# Patient Record
Sex: Female | Born: 1980 | Race: White | Hispanic: No | Marital: Married | Smoking: Former smoker
Health system: Southern US, Community
[De-identification: ages and names within clinical notes are randomized; demographics above are authoritative.]

## PROBLEM LIST (undated history)

## (undated) DIAGNOSIS — F32A Depression, unspecified: Secondary | ICD-10-CM

## (undated) DIAGNOSIS — F329 Major depressive disorder, single episode, unspecified: Secondary | ICD-10-CM

## (undated) DIAGNOSIS — N979 Female infertility, unspecified: Secondary | ICD-10-CM

## (undated) HISTORY — PX: TONSILLECTOMY: SHX5217

## (undated) HISTORY — DX: Major depressive disorder, single episode, unspecified: F32.9

## (undated) HISTORY — DX: Depression, unspecified: F32.A

## (undated) HISTORY — PX: TONSILLECTOMY: SUR1361

## (undated) HISTORY — PX: NECK SURGERY: SHX720

## (undated) HISTORY — DX: Female infertility, unspecified: N97.9

---

## 2012-04-28 ENCOUNTER — Encounter: Payer: Self-pay | Admitting: Family Medicine

## 2012-04-28 ENCOUNTER — Ambulatory Visit (INDEPENDENT_AMBULATORY_CARE_PROVIDER_SITE_OTHER): Payer: BC Managed Care – PPO | Admitting: Family Medicine

## 2012-04-28 VITALS — BP 108/68 | HR 80 | Temp 98.2°F | Ht 63.0 in | Wt 230.0 lb

## 2012-04-28 DIAGNOSIS — Z23 Encounter for immunization: Secondary | ICD-10-CM

## 2012-04-28 DIAGNOSIS — L723 Sebaceous cyst: Secondary | ICD-10-CM

## 2012-04-28 DIAGNOSIS — F3289 Other specified depressive episodes: Secondary | ICD-10-CM | POA: Insufficient documentation

## 2012-04-28 DIAGNOSIS — F329 Major depressive disorder, single episode, unspecified: Secondary | ICD-10-CM
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
## 2012-04-28 NOTE — Patient Instructions (Signed)
Warm compresses to left underarm and right breast lesion. If the area of redness on the right breast increases in size, develops streaks, gets hot, if you get fevers, then you likely need an antibiotic to treat for a skin infection.  For now, just use warm compresses 2-3 x daily, and apply an antibacterial ointment like bacitracin 2-3 times per day.  Epidermal Cyst An epidermal cyst is sometimes called a sebaceous cyst, epidermal inclusion cyst, or infundibular cyst. These cysts usually contain a substance that looks "pasty" or "cheesy" and may have a bad smell. This substance is a protein called keratin. Epidermal cysts are usually found on the face, neck, or trunk. They may also occur in the vaginal area or other parts of the genitalia of both men and women. Epidermal cysts are usually small, painless, slow-growing bumps or lumps that move freely under the skin. It is important not to try to pop them. This may cause an infection and lead to tenderness and swelling. CAUSES  Epidermal cysts may be caused by a deep penetrating injury to the skin or a plugged hair follicle, often associated with acne. SYMPTOMS  Epidermal cysts can become inflamed and cause:  Redness.  Tenderness.  Increased temperature of the skin over the bumps or lumps.  Grayish-white, bad smelling material that drains from the bump or lump. DIAGNOSIS  Epidermal cysts are easily diagnosed by your caregiver during an exam. Rarely, a tissue sample (biopsy) may be taken to rule out other conditions that may resemble epidermal cysts. TREATMENT   Epidermal cysts often get better and disappear on their own. They are rarely ever cancerous.  If a cyst becomes infected, it may become inflamed and tender. This may require opening and draining the cyst. Treatment with antibiotics may be necessary. When the infection is gone, the cyst may be removed with minor surgery.  Small, inflamed cysts can often be treated with antibiotics or by  injecting steroid medicines.  Sometimes, epidermal cysts become large and bothersome. If this happens, surgical removal in your caregiver's office may be necessary. HOME CARE INSTRUCTIONS  Only take over-the-counter or prescription medicines as directed by your caregiver.  Take your antibiotics as directed. Finish them even if you start to feel better. SEEK MEDICAL CARE IF:   Your cyst becomes tender, red, or swollen.  Your condition is not improving or is getting worse.  You have any other questions or concerns. MAKE SURE YOU:  Understand these instructions.  Will watch your condition.  Will get help right away if you are not doing well or get worse. Document Released: 06/06/2004 Document Revised: 09/28/2011 Document Reviewed: 01/12/2011 [facility] Patient Information 2013 [place], [place].    Look into getting an appointment with Behavioral Health (through [facility]) for evaluation and treatment of depression.

## 2012-05-13 ENCOUNTER — Ambulatory Visit: Payer: BC Managed Care – PPO

## 2012-05-13 ENCOUNTER — Ambulatory Visit (INDEPENDENT_AMBULATORY_CARE_PROVIDER_SITE_OTHER): Payer: BC Managed Care – PPO | Admitting: Family Medicine

## 2012-05-13 VITALS — BP 128/82 | HR 109 | Temp 98.6°F | Resp 17 | Ht 63.0 in | Wt 226.0 lb

## 2012-05-13 DIAGNOSIS — B349 Viral infection, unspecified: Secondary | ICD-10-CM

## 2012-05-13 DIAGNOSIS — R109 Unspecified abdominal pain: Secondary | ICD-10-CM

## 2012-05-13 DIAGNOSIS — R509 Fever, unspecified: Secondary | ICD-10-CM

## 2012-05-13 DIAGNOSIS — B9789 Other viral agents as the cause of diseases classified elsewhere: Secondary | ICD-10-CM

## 2012-05-13 DIAGNOSIS — IMO0001 Reserved for inherently not codable concepts without codable children: Secondary | ICD-10-CM

## 2012-05-13 DIAGNOSIS — F329 Major depressive disorder, single episode, unspecified: Secondary | ICD-10-CM

## 2012-05-13 DIAGNOSIS — M791 Myalgia, unspecified site: Secondary | ICD-10-CM

## 2012-05-13 LAB — POCT URINALYSIS DIPSTICK
Glucose, UA: NEGATIVE
Ketones, UA: 15
Leukocytes, UA: NEGATIVE
Nitrite, UA: NEGATIVE
Spec Grav, UA: 1.025
Urobilinogen, UA: 0.2
pH, UA: 5.5

## 2012-05-13 LAB — POCT CBC
Hemoglobin: 14.2 g/dL (ref 12.2–16.2)
Lymph, poc: 1.4 (ref 0.6–3.4)
MCHC: 31.8 g/dL (ref 31.8–35.4)
MPV: 8 fL (ref 0–99.8)
POC Granulocyte: 4 (ref 2–6.9)
POC LYMPH PERCENT: 23.3 %L (ref 10–50)
POC MID %: 7.1 %M (ref 0–12)
RDW, POC: 12.7 %

## 2012-05-13 LAB — POCT UA - MICROSCOPIC ONLY
Casts, Ur, LPF, POC: NEGATIVE
Crystals, Ur, HPF, POC: NEGATIVE
Yeast, UA: NEGATIVE

## 2012-05-13 LAB — GLUCOSE, POCT (MANUAL RESULT ENTRY): POC Glucose: 96 mg/dl (ref 70–99)

## 2012-05-13 NOTE — Patient Instructions (Addendum)
Get plenty of rest and drink at least 64 ounces of water daily. Use ibuprofen or acetaminophen as needed for fever or muscle aches.

## 2012-05-13 NOTE — Progress Notes (Signed)
Subjective:    Patient ID: Annette Ashley, female    DOB: 06-22-1981, 31 y.o.   MRN: [id number]  HPI  This 31 y.o. female presents for evaluation of chills and achy x 3 days.  Also right-sided low abdominal pain with urination.  Last night, temperature was 102.4.  Took Excedrin and went to bed.  This morning, 100.7, and developed abdominal cramping and increased stool frequency and loose stools.  Additionally, reports 1 week of right sided low back pain.  Some nausea, loss of appetite (last eating yesterday afternoon).  No vomiting.  No hematuria.  No blood or mucous in the stools.  No urinary frequency or burning.  Mild urgency in the mornings. No known sick contacts.  History of UTI, this feels different. History of nephrolithiasis-had back pain, different from this.  Long-standing depression.  Not currently on treatment.  Denies thoughts of self or other harm.  Not motivated.  Doesn't want to be around people. Hasn't gone back to work since she and her husband moved here from [place] (prior to that, they lived in [place]).  Review of Systems As above.   Past Medical History  Diagnosis Date  . Depression     Past Surgical History  Procedure Date  . Tonsillectomy age 54  . Neck surgery     cyst removed from L anterior neck    Prior to Admission medications   Not on File    Allergies  Allergen Reactions  . Clindamycin/Lincomycin Rash    Oral rash    History   Social History  . Marital Status: Married    Spouse Name: Jomarie Longs    Number of Children: 0  . Years of Education: 10   Occupational History  . homemaker    Social History Main Topics  . Smoking status: Former Smoker    Types: Cigarettes    Quit date: 09/28/2009  . Smokeless tobacco: Never Used  . Alcohol Use: No  . Drug Use: No  . Sexually Active: Yes -- Female partner(s)    Birth Control/ Protection: None     infertility work-up found no cause   Other Topics Concern  . Not on file   Social  History Narrative   Lives with husband, 2 dogsCompleted the 10th grade, then obtained her GED. Recently moved to [facility] for her husband's job.      Family History  Problem Relation Age of Onset  . Cancer Father     lung cancer  . Heart disease Father   . Diabetes Father   . Kidney disease Father     on dialysis  . Bipolar disorder Brother   . Diabetes Maternal Aunt   . Diabetes Maternal Uncle   . Diabetes Paternal Aunt   . Diabetes Paternal Uncle   . Diabetes Maternal Grandmother   . Diabetes Paternal Grandmother   . Breast cancer Cousin        Objective:   Physical Exam  Blood pressure 128/82, pulse 109, temperature 98.6 F (37 C), temperature source Oral, resp. rate 17, height 5\' 3"  (1.6 m), weight 226 lb (102.513 kg), last menstrual period 04/21/2012, SpO2 98.00%. Body mass index is 40.03 kg/(m^2). Well-developed, well nourished WF who is awake, alert and oriented, in NAD. HEENT: /AT, PERRL, EOMI.  Fundi are normal. Sclera and conjunctiva are clear.  EAC are patent, TMs are normal in appearance. Nasal mucosa is pink and moist. OP is clear. Neck: supple, non-tender, no lymphadenopathy, thyromegaly. Heart: RRR, no murmur Lungs: normal  effort, CTA Abdomen: normo-active bowel sounds, supple, non-tender, no mass or organomegaly. No CVA tenderness. Extremities: no cyanosis, clubbing or edema. Skin: warm and dry without rash. Psychologic: good mood and appropriate affect, normal speech and behavior.   Results for orders placed in visit on 05/13/12  POCT UA - MICROSCOPIC ONLY      Component Value Range   WBC, Ur, HPF, POC 2-4     RBC, urine, microscopic 3-5     Bacteria, U Microscopic 1+     Mucus, UA 2+     Epithelial cells, urine per micros 3-6     Crystals, Ur, HPF, POC neg     Casts, Ur, LPF, POC neg     Yeast, UA neg    POCT URINALYSIS DIPSTICK      Component Value Range   Color, UA yellow     Clarity, UA clear     Glucose, UA neg     Bilirubin, UA small      Ketones, UA 15     Spec Grav, UA 1.025     Blood, UA trace     pH, UA 5.5     Protein, UA trace     Urobilinogen, UA 0.2     Nitrite, UA neg     Leukocytes, UA Negative    POCT CBC      Component Value Range   WBC 5.8  4.6 - 10.2 K/uL   Lymph, poc 1.4  0.6 - 3.4   POC LYMPH PERCENT 23.3  10 - 50 %L   MID (cbc) 0.4  0 - 0.9   POC MID % 7.1  0 - 12 %M   POC Granulocyte 4.0  2 - 6.9   Granulocyte percent 69.6  37 - 80 %G   RBC 5.00  4.04 - 5.48 M/uL   Hemoglobin 14.2  12.2 - 16.2 g/dL   HCT, POC 40.9  81.1 - 47.9 %   MCV 89.4  80 - 97 fL   MCH, POC 28.4  27 - 31.2 pg   MCHC 31.8  31.8 - 35.4 g/dL   RDW, POC 91.4     Platelet Count, POC 271  142 - 424 K/uL   MPV 8.0  0 - 99.8 fL  GLUCOSE, POCT (MANUAL RESULT ENTRY)      Component Value Range   POC Glucose 96  70 - 99 mg/dl   Acute Abdominal Series: [facility] reading (PRIMARY) by  Dr. Katrinka Blazing.  No free air.  Chest normal.  Non-specific bowel gas pattern with increased air in the ascending colon.  No ileus.  No mass. Normal abdomen/pelvis.     Assessment & Plan:   1. Viral illness    2. Abdominal pain  POCT UA - Microscopic Only, POCT urinalysis dipstick, Urine culture, POCT glucose (manual entry), DG Abd Acute W/Chest  3. Fever  Urine culture, POCT CBC, DG Abd Acute W/Chest  4. Muscle pain    5. Depressive disorder, not elsewhere classified     Anticipatory guidance provided.  Supportive care.  RTC if symptoms worsen/persist.  Discussed with Dr. Katrinka Blazing.

## 2012-05-16 ENCOUNTER — Telehealth: Payer: Self-pay

## 2012-05-16 NOTE — Telephone Encounter (Signed)
Please call patient. Urine culture was NEGATIVE. RTC if symptoms persist.

## 2012-05-16 NOTE — Telephone Encounter (Signed)
Pt is returning a call from the lab  Best number [phone]

## 2012-05-16 NOTE — Telephone Encounter (Signed)
Called pt, she is advised.

## 2012-06-25 NOTE — Progress Notes (Signed)
Reviewed and agree.

## 2012-07-28 ENCOUNTER — Encounter: Payer: Self-pay | Admitting: Internal Medicine

## 2012-08-11 ENCOUNTER — Ambulatory Visit (INDEPENDENT_AMBULATORY_CARE_PROVIDER_SITE_OTHER): Payer: BC Managed Care – PPO | Admitting: Family Medicine

## 2012-08-11 ENCOUNTER — Other Ambulatory Visit (HOSPITAL_COMMUNITY)
Admission: RE | Admit: 2012-08-11 | Discharge: 2012-08-11 | Disposition: A | Discharge status: Home / Self Care | Payer: BC Managed Care – PPO | Source: Ambulatory Visit | Attending: Family Medicine | Admitting: Family Medicine

## 2012-08-11 ENCOUNTER — Encounter: Payer: Self-pay | Admitting: Family Medicine

## 2012-08-11 VITALS — BP 120/70 | HR 72 | Ht 63.0 in | Wt 237.0 lb

## 2012-08-11 DIAGNOSIS — Z6841 Body Mass Index (BMI) 40.0 and over, adult: Secondary | ICD-10-CM

## 2012-08-11 DIAGNOSIS — R5381 Other malaise: Secondary | ICD-10-CM

## 2012-08-11 DIAGNOSIS — N926 Irregular menstruation, unspecified: Secondary | ICD-10-CM

## 2012-08-11 DIAGNOSIS — Z13220 Encounter for screening for lipoid disorders: Secondary | ICD-10-CM

## 2012-08-11 DIAGNOSIS — R5383 Other fatigue: Secondary | ICD-10-CM

## 2012-08-11 DIAGNOSIS — Z0000 Encounter for general adult medical examination without abnormal findings: Secondary | ICD-10-CM

## 2012-08-11 DIAGNOSIS — Z23 Encounter for immunization: Secondary | ICD-10-CM

## 2012-08-11 DIAGNOSIS — Z8249 Family history of ischemic heart disease and other diseases of the circulatory system: Secondary | ICD-10-CM

## 2012-08-11 DIAGNOSIS — Z01419 Encounter for gynecological examination (general) (routine) without abnormal findings: Secondary | ICD-10-CM | POA: Insufficient documentation

## 2012-08-11 LAB — COMPREHENSIVE METABOLIC PANEL
Albumin: 4.3 g/dL (ref 3.5–5.2)
Alkaline Phosphatase: 73 U/L (ref 39–117)
CO2: 24 mEq/L (ref 19–32)
Chloride: 103 mEq/L (ref 96–112)
Glucose, Bld: 84 mg/dL (ref 70–99)
Potassium: 4.1 mEq/L (ref 3.5–5.3)
Sodium: 139 mEq/L (ref 135–145)
Total Protein: 6.9 g/dL (ref 6.0–8.3)

## 2012-08-11 LAB — LIPID PANEL: Total CHOL/HDL Ratio: 4 Ratio

## 2012-08-11 LAB — CBC WITH DIFFERENTIAL/PLATELET
Basophils Relative: 0 % (ref 0–1)
Eosinophils Absolute: 0.2 10*3/uL (ref 0.0–0.7)
Lymphs Abs: 2.9 10*3/uL (ref 0.7–4.0)
MCH: 28.7 pg (ref 26.0–34.0)
MCHC: 34.2 g/dL (ref 30.0–36.0)
Neutrophils Relative %: 57 % (ref 43–77)
Platelets: 347 10*3/uL (ref 150–400)
RBC: 4.64 MIL/uL (ref 3.87–5.11)

## 2012-08-11 NOTE — Patient Instructions (Signed)
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
## 2012-08-12 ENCOUNTER — Encounter: Payer: Self-pay | Admitting: Family Medicine

## 2012-08-12 DIAGNOSIS — E559 Vitamin D deficiency, unspecified: Secondary | ICD-10-CM | POA: Insufficient documentation

## 2012-08-12 LAB — VITAMIN D 25 HYDROXY (VIT D DEFICIENCY, FRACTURES): Vit D, 25-Hydroxy: 19 ng/mL — ABNORMAL LOW (ref 30–89)

## 2012-08-15 ENCOUNTER — Encounter: Payer: Self-pay | Admitting: Family Medicine

## 2012-08-15 ENCOUNTER — Other Ambulatory Visit: Payer: Self-pay | Admitting: *Deleted

## 2012-08-15 DIAGNOSIS — E559 Vitamin D deficiency, unspecified: Secondary | ICD-10-CM

## 2012-08-15 DIAGNOSIS — R748 Abnormal levels of other serum enzymes: Secondary | ICD-10-CM

## 2012-08-15 MED ORDER — ERGOCALCIFEROL 1.25 MG (50000 UT) PO CAPS: 50000.0000 [IU] | ORAL_CAPSULE | ORAL | Status: DC

## 2012-09-06 ENCOUNTER — Ambulatory Visit (HOSPITAL_COMMUNITY): Payer: BC Managed Care – PPO | Admitting: Psychiatry

## 2012-09-15 ENCOUNTER — Telehealth: Payer: Self-pay | Admitting: *Deleted

## 2012-09-15 ENCOUNTER — Encounter: Payer: Self-pay | Admitting: Family Medicine

## 2012-09-15 NOTE — Telephone Encounter (Signed)
Patient informed. 
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
## 2012-09-15 NOTE — Telephone Encounter (Signed)
Advise pt that warm soaks may help.  Also a barrier cream like a desitin (diaper creme) should lessen the irritation.  If this is a cut/abrasion related to trauma from sex, it usually heals pretty quickly over a couple of days.  The other thing in the differential is ulceration from something like herpes.  Would need eval for treatment other than these topical measures.

## 2012-10-27 ENCOUNTER — Encounter: Payer: Self-pay | Admitting: Family Medicine

## 2012-11-15 ENCOUNTER — Encounter: Payer: Self-pay | Admitting: Family Medicine

## 2012-11-17 ENCOUNTER — Ambulatory Visit (INDEPENDENT_AMBULATORY_CARE_PROVIDER_SITE_OTHER): Payer: BC Managed Care – PPO | Admitting: Family Medicine

## 2012-11-17 ENCOUNTER — Encounter: Payer: Self-pay | Admitting: Family Medicine

## 2012-11-17 VITALS — BP 130/88 | HR 80 | Temp 98.3°F | Ht 63.0 in | Wt 229.0 lb

## 2012-11-17 DIAGNOSIS — J029 Acute pharyngitis, unspecified: Secondary | ICD-10-CM

## 2012-11-17 DIAGNOSIS — J069 Acute upper respiratory infection, unspecified: Secondary | ICD-10-CM

## 2012-11-17 NOTE — Progress Notes (Signed)
Chief Complaint  Patient presents with  . Sore Throat    runny nose, nasal congestion, coughing, headache and nausea. Took allergy medicine and alka-seltzer day/night without any relief.  (negative strep test)   Started 5 days ago with itchy throat, watery eyes, then progressed to sore throat, runny/stuffy nose, headaches (posterior) and cough.  Nasal mucus is usually clear, but sometimes sees blood and yellow, but is mostly clear.  Cough is usually dry, once coughed up a "chunk" that was yellow.  Thinks she may have had a fever, as she was shivering in bed.  She also had some myalgias.  Husband was sick with similar symptoms for about 10 days, and resolved.  Didn't need antibiotics, but took some allergy meds. No h/o allergies in the past.  Took alka-seltzer day/night without much benefit.  Also took a generic zyrtec. Main complaint is sore throat.  Past Medical History  Diagnosis Date  . Depression   . Infertility, female    History   Social History  . Marital Status: Married    Spouse Name: Jomarie Longs    Number of Children: 0  . Years of Education: 10   Occupational History  . homemaker    Social History Main Topics  . Smoking status: Former Smoker    Types: Cigarettes    Quit date: 09/28/2009  . Smokeless tobacco: Never Used  . Alcohol Use: No  . Drug Use: No  . Sexually Active: Yes -- Female partner(s)    Birth Control/ Protection: None     Comment: infertility work-up found no cause   Other Topics Concern  . Not on file   Social History Narrative   Lives with husband, 2 dogs   Completed the 10th grade, then obtained her GED. Recently moved [place] [facility] [place] her husband's job.     Current Outpatient Prescriptions on File Prior to Visit  Medication Sig Dispense Refill  . ergocalciferol (VITAMIN D2) 50000 UNITS capsule Take 1 capsule (50,000 Units total) by mouth once a week.  4 capsule  2   No current facility-administered medications on file prior to visit.    Allergies  Allergen Reactions  . Clindamycin/Lincomycin Rash    Oral rash   ROS: Denies sinus pain, vomiting or diarrhea, but does have some nausea, skin rashes.  Some chronic pain in left leg, otherwise no arthralgias, some muscle aches.  PHYSICAL EXAM: BP 130/88  Pulse 80  Temp(Src) 98.3 F (36.8 C) (Oral)  Ht 5\' 3"  (1.6 m)  Wt 229 lb (103.874 kg)  BMI 40.58 kg/m2  LMP 10/16/2012  Well developed, obese female, sounds nasal, in no distress.  Rare cough HEENT:  PERRL, EOMI, conjunctiva clear.  TM's and EAC's normal. Nasal mucosa mildly edematous, no erythema or purulence.  OP with erythema posteriorly, otherwise normal.  Moist mucus membranes.  Sinuses--minimal tenderness over maxillary sinuses Neck: shotty lymphadenopathy Heart: regular rate and rhythm without murmur Lungs: clear bilaterally Extremities: no edema Skin: no rash Psych: flat affect  ASSESSMENT/PLAN: Acute upper respiratory infections of unspecified site  Sore throat - Plan: Rapid Strep A  URI Supportive measures reviewed.  Continue with zyrtec daily. Mucinex  (plain, or the DM version if still coughing a lot) Use sudafed as needed (decongestant) for sinus pain and ongoing congestion/runny nose. Use ibuprofen or aleve, and/or tylenol if needed for sore throat/headache You may also try salt water gargles and/or chloraseptic spray and lozenges to help with your throat pain.  Expect symptoms to improve in the next  3-5 days.  Call next week for antibiotic if you have ongoing/worsening symptoms, especially if fevers, discolored mucus, worsening sinus pain.

## 2012-11-17 NOTE — Patient Instructions (Signed)
  Continue with zyrtec daily. Mucinex  (plain, or the DM version if still coughing a lot) Use sudafed as needed (decongestant) for sinus pain and ongoing congestion/runny nose. Use ibuprofen or aleve, and/or tylenol if needed for sore throat/headache You may also try salt water gargles and/or chloraseptic spray and lozenges to help with your throat pain.  Expect symptoms to improve in the next 3-5 days.  Call next week for antibiotic if you have ongoing/worsening symptoms, especially if fevers, discolored mucus, worsening sinus pain.  I recommend the above over-the-counter medications to be used IN PLACE of the alka selzer medication you have been using.

## 2013-02-09 ENCOUNTER — Other Ambulatory Visit: Payer: BC Managed Care – PPO

## 2013-02-23 ENCOUNTER — Encounter: Payer: Self-pay | Admitting: Family Medicine

## 2013-02-23 ENCOUNTER — Ambulatory Visit (INDEPENDENT_AMBULATORY_CARE_PROVIDER_SITE_OTHER): Payer: BC Managed Care – PPO | Admitting: Family Medicine

## 2013-02-23 VITALS — BP 124/84 | HR 68 | Ht 63.0 in | Wt 228.0 lb

## 2013-02-23 DIAGNOSIS — N898 Other specified noninflammatory disorders of vagina: Secondary | ICD-10-CM

## 2013-02-23 DIAGNOSIS — N939 Abnormal uterine and vaginal bleeding, unspecified: Secondary | ICD-10-CM

## 2013-02-23 DIAGNOSIS — R002 Palpitations: Secondary | ICD-10-CM

## 2013-02-23 DIAGNOSIS — E559 Vitamin D deficiency, unspecified: Secondary | ICD-10-CM

## 2013-02-23 DIAGNOSIS — R748 Abnormal levels of other serum enzymes: Secondary | ICD-10-CM

## 2013-02-23 LAB — CBC WITH DIFFERENTIAL/PLATELET
Basophils Relative: 0 % (ref 0–1)
Hemoglobin: 13.1 g/dL (ref 12.0–15.0)
Lymphs Abs: 2.8 10*3/uL (ref 0.7–4.0)
MCHC: 34.5 g/dL (ref 30.0–36.0)
Monocytes Relative: 6 % (ref 3–12)
Neutro Abs: 4.9 10*3/uL (ref 1.7–7.7)
Neutrophils Relative %: 58 % (ref 43–77)
RBC: 4.49 MIL/uL (ref 3.87–5.11)
WBC: 8.5 10*3/uL (ref 4.0–10.5)

## 2013-02-23 LAB — POCT URINE PREGNANCY: Preg Test, Ur: NEGATIVE

## 2013-02-23 MED ORDER — MEDROXYPROGESTERONE ACETATE 10 MG PO TABS: 10.0000 mg | ORAL_TABLET | Freq: Every day | ORAL | Status: DC

## 2013-02-23 NOTE — Patient Instructions (Signed)
Start the provera today.  Take it for 10 days.  Bleeding should stop while you are this medication.  You will have bleeding recur (might be like a regular period, but might be light) within 10 days (usually just 2-3 days after) of completing the provera.  Please contact our office if you do not have ANY bleeding within 10 days of the provera.    If your periods remain very irregular, and you desire to start the birth control pills to regulate them, please call the office (appointment isn't necessary, as long as we can confirm you aren't pregnant).

## 2013-02-23 NOTE — Progress Notes (Signed)
Chief Complaint  Patient presents with  . Menstrual Problem    havs had a period since June 28th that will not stop. Also was supposed to have hepatic panel and vit d done 02/09/13, would like to do today. Also states that her heart races sometimes. And she has some random bumps if you had time to take a look.   Periods had been regular, monthly, a little lighter than usual, since her last physical in January, up until the end of June (prior to that her menses were irregular--see discussion at last physical).  Her period the end of June was very heavy, and never really stopped since then.  She would have the period get very light for 2-3 days, never completely stop, then start again.  She had heavy bleeding x 10 days up until 2 days ago, when it again lightened up.  Having light bleeding today.  She had severe L low back/side pain on 6/28, which was on and off that whole day (was very severe for just 2 minutes) with intermittent mild discomfort since then.  Denies any significant abdominal cramping.  She has been feeling weak.  Denies lightheadedness.  Her heart will sometimes start racing fast and hard, lasts just for a few seconds.  Will occur at rest. Happens just a few times/week.  Past Medical History  Diagnosis Date  . Depression   . Infertility, female    Past Surgical History  Procedure Laterality Date  . Tonsillectomy  age 53  . Neck surgery      cyst removed from L anterior neck   History   Social History  . Marital Status: Married    Spouse Name: Jomarie Longs    Number of Children: 0  . Years of Education: 10   Occupational History  . homemaker    Social History Main Topics  . Smoking status: Former Smoker    Types: Cigarettes    Quit date: 09/28/2009  . Smokeless tobacco: Never Used  . Alcohol Use: No  . Drug Use: No  . Sexually Active: Yes -- Female partner(s)    Birth Control/ Protection: None     Comment: infertility work-up found no cause   Other Topics Concern  . Not  on file   Social History Narrative   Lives with husband, 2 dogs   Completed the 10th grade, then obtained her GED. Recently moved to [facility] for her husband's job.     Current outpatient prescriptions:cholecalciferol (VITAMIN D) 1000 UNITS tablet, Take 1,000 Units by mouth daily., Disp: , Rfl:   Allergies  Allergen Reactions  . Clindamycin/Lincomycin Rash    Oral rash   ROS: Denies urgency, frequency, dysuria or hematuria.  Denies abnormal vaginal discharge, itching.  She notices a small "knot" above the clitoris, "internally" per pt.  Nontender, and hasn't changed for months. No cough, shortness of breath, or other complaints except per HPI.  PHYSICAL EXAM: BP 124/84  Pulse 68  Ht 5\' 3"  (1.6 m)  Wt 228 lb (103.42 kg)  BMI 40.4 kg/m2  LMP 01/14/2013 Well developed, obese female in no distress, accompanied by her husband Neck: no lymphadenopathy or thyromegaly Heart:  Regular rate and rhythm without ectopy, murmur, rub or gallop Lungs: clear bilaterally Abdomen: soft, minimal tenderness across lower abdomen--no focal tenderness, rebound, guarding or mass Extremities: no edema Skin: <6mm papule on inner R arm, small dry patch inner L arm, nonspecific Pelvic exam deferred.  ASSESSMENT/PLAN: Abnormal vaginal bleeding - Plan: CBC with Differential, POCT urine  pregnancy, medroxyPROGESTERone (PROVERA) 10 MG tablet  Unspecified vitamin D deficiency - Plan: Vitamin D, 25-hydroxy  Abnormal liver enzymes - Plan: Hepatic Function Panel  Palpitations  Abnormal vaginal bleeding.  Likely anovulatory.  Recommend Provera 10mg  x 10 days.  Expect bleeding to stop while on medication, but then having a withdrawal bleed/period.  That should reset things to stop this chronic bleeding, although periods may not be regular.  Elects to hold off on starting OCP's after the provera course, and see what her periods do. If she has NO withdrawal bleed at all, she is to contact us.  At that point we may  elect to put her on OCP's for 3-38months.  We briefly discussed that ultrasound may be indicated at some point for evaluation of ongoing abnormal bleeding.   Discussed fertility--if she has any desire to try for pregnancy, we can refer her back  (previously didn't tolerate metformin).  Risks/benefits of OCP's reviewed with patient, so if she decides in future to start, can call for rx (after clarifying that she couldn't potentially be pregnant, based on LMP or home pregnancy test).  Palpitations--encouraged avoidance of caffeine, keep well hydrated.  Discussed differential diagnosis (PVC's, sinus tach, SVT, afib vs other arrhythmia).  Given infrequent, short-lived with no associated symptoms, will just monitor for now.  Thyroid test was normal in January.  Reassured re: skin concerns (nonspecific, possible insect bite)

## 2013-02-24 LAB — HEPATIC FUNCTION PANEL
Albumin: 4.2 g/dL (ref 3.5–5.2)
Total Protein: 6.9 g/dL (ref 6.0–8.3)

## 2013-02-24 LAB — VITAMIN D 25 HYDROXY (VIT D DEFICIENCY, FRACTURES): Vit D, 25-Hydroxy: 28 ng/mL — ABNORMAL LOW (ref 30–89)

## 2013-05-08 ENCOUNTER — Other Ambulatory Visit (INDEPENDENT_AMBULATORY_CARE_PROVIDER_SITE_OTHER): Payer: BC Managed Care – PPO

## 2013-05-08 DIAGNOSIS — Z23 Encounter for immunization: Secondary | ICD-10-CM

## 2013-07-19 ENCOUNTER — Encounter: Payer: Self-pay | Admitting: Family Medicine

## 2013-07-21 ENCOUNTER — Encounter: Payer: Self-pay | Admitting: Medical

## 2013-07-21 ENCOUNTER — Ambulatory Visit (INDEPENDENT_AMBULATORY_CARE_PROVIDER_SITE_OTHER): Payer: BC Managed Care – PPO | Admitting: Medical

## 2013-07-21 VITALS — BP 112/70 | HR 88 | Temp 98.3°F | Resp 16 | Wt 223.0 lb

## 2013-07-21 DIAGNOSIS — R300 Dysuria: Secondary | ICD-10-CM

## 2013-07-21 LAB — POCT URINALYSIS DIPSTICK
Bilirubin, UA: NEGATIVE
GLUCOSE UA: NEGATIVE
KETONES UA: NEGATIVE
Nitrite, UA: NEGATIVE
SPEC GRAV UA: 1.02
UROBILINOGEN UA: NEGATIVE
pH, UA: 5

## 2013-07-21 MED ORDER — SULFAMETHOXAZOLE-TMP DS 800-160 MG PO TABS
1.0000 | ORAL_TABLET | Freq: Two times a day (BID) | ORAL | Status: DC
Start: 2013-07-21 — End: 2018-07-07

## 2013-07-21 NOTE — Progress Notes (Signed)
Subjective:  Annette SinkHolly Leete is a 33 y.o. female who complains of possible urinary tract infection.  She has had symptoms for 2 week or urgency to urinate.  But started to get blood when wiping 3 days ago.  Symptoms include urinary urgency, had some frequency but this calmed down, saw blood on tissue Wednesday.  Feels like the clitoris is swollen.  Denies redness or itching.  No vaginal itching.   Urine smells odorous, looks a little hazy. She notes some lower abdominal pressure and back pain.  Drinks lots of water, but does drink coffee.  Feels a bump.  Patient denies fever, no vaginal discharge.  Last UTI was long time ago, year 2000.   Using nothing for current symptoms.  Patient does not have a history of pyelonephritis.  She notes monogamous relationship, married 23 years, no concern for STD.  Here with husband today.  She does note hx/o PID years ago.   Last pap last year, normal.  No other aggravating or relieving factors.  No other c/o.  Past Medical History  Diagnosis Date  . Depression   . Infertility, female     ROS as in subjective  Reviewed allergies, medications, past medical, surgical, and social history.    Objective: Filed Vitals:   07/21/13 0832  BP: 112/70  Pulse: 88  Temp: 98.3 F (36.8 C)  Resp: 16    General appearance: alert, no distress, WD/WN, female Abdomen: +bs, soft, mild suprapubic tenderness, otherwise non tender, non distended, no masses, no hepatomegaly, no splenomegaly, no bruits Back: no CVA tenderness Gyn: Normal external genitalia without lesions, there is no obvious abnormality in the area in which she notes tenderness (right labia minora), clitoris not swollen, vagina with normal mucosa, cervix without lesions, no cervical motion tenderness, no abnormal vaginal discharge.  Uterus and adnexa not enlarged, nontender, no masses.   Exam chaperoned by nurse.      Assessment: Encounter Diagnosis  Name Primary?  . Dysuria Yes     Plan: Discussed  symptoms, diagnosis, possible complications, and usual course of illness.  Begin medication Bactrim per orders below.  Advised increased water intake, can use OTC Tylenol for pain.  discused preventative measures for UTI.  Change back to the soap she was using prior.    Urine culture sent.   Answered her questions.  Reassured there are no obvious external findings such as vesicles, cellulitis, bartholins cyst, abscess, etc.   Call or return if worse or not improving.  F/u pending culture

## 2013-07-21 NOTE — Patient Instructions (Signed)
For this likely urinary tract infection  Begin Bactrim antibiotic twice daily for 7 days  Increase your water intake significantly  You can do a few more days of cranberry juice  Go back to the soap you were using for hygiene  We will call Monday with culture results  If any new symptoms, fever, severe pain, vaginal discharge, other, then call or return  Use other forms of birth control such as condoms for the next month if you're on birth control.  Antibiotics can reduce the effectiveness of birth control.  Urinary Tract Infection Infections of the urinary tract can start in several places. A bladder infection (cystitis), a kidney infection (pyelonephritis), and a prostate infection (prostatitis) are different types of urinary tract infections (UTIs). They usually get better if treated with medicines (antibiotics) that kill germs. Take all the medicine until it is gone. You or your child may feel better in a few days, but TAKE ALL MEDICINE or the infection may not respond and may become more difficult to treat. HOME CARE INSTRUCTIONS   Drink enough water and fluids to keep the urine clear or pale yellow. Cranberry juice is especially recommended, in addition to large amounts of water.   Avoid caffeine, tea, and carbonated beverages. They tend to irritate the bladder.   Alcohol may irritate the prostate.   Only take over-the-counter or prescription medicines for pain, discomfort, or fever as directed by your caregiver.  To prevent further infections:  Empty the bladder often. Avoid holding urine for long periods of time.   After a bowel movement, women should cleanse from front to back. Use each tissue only once.   Empty the bladder before and after sexual intercourse.  FINDING OUT THE RESULTS OF YOUR TEST Not all test results are available during your visit. If your or your child's test results are not back during the visit, make an appointment with your caregiver to find out the  results. Do not assume everything is normal if you have not heard from your caregiver or the medical facility. It is important for you to follow up on all test results. SEEK MEDICAL CARE IF:   There is back pain.   Your baby is older than 3 months with a rectal temperature of 100.5 F (38.1 C) or higher for more than 1 day.   Your or your child's problems (symptoms) are no better in 3 days. Return sooner if you or your child is getting worse.  SEEK IMMEDIATE MEDICAL CARE IF:   There is severe back pain or lower abdominal pain.   You or your child develops chills.   You have a fever.   Your baby is older than 3 months with a rectal temperature of 102 F (38.9 C) or higher.   Your baby is 353 months old or younger with a rectal temperature of 100.4 F (38 C) or higher.   There is nausea or vomiting.   There is continued burning or discomfort with urination.  MAKE SURE YOU:   Understand these instructions.   Will watch your condition.   Will get help right away if you are not doing well or get worse.  Document Released: 04/15/2005 Document Revised: 03/18/2011 Document Reviewed: 11/18/2006 [facility] Patient Information 2012 [employer], [place].

## 2013-07-23 LAB — URINE CULTURE: Colony Count: 40000

## 2013-09-14 ENCOUNTER — Encounter: Payer: Self-pay | Admitting: Family Medicine

## 2013-10-07 ENCOUNTER — Ambulatory Visit (INDEPENDENT_AMBULATORY_CARE_PROVIDER_SITE_OTHER): Payer: BC Managed Care – PPO | Admitting: Emergency Medicine

## 2013-10-07 ENCOUNTER — Ambulatory Visit: Payer: BC Managed Care – PPO

## 2013-10-07 VITALS — BP 128/84 | HR 82 | Temp 98.2°F | Resp 18 | Ht 62.0 in | Wt 222.8 lb

## 2013-10-07 DIAGNOSIS — M25569 Pain in unspecified knee: Secondary | ICD-10-CM

## 2013-10-07 DIAGNOSIS — M25561 Pain in right knee: Secondary | ICD-10-CM

## 2013-10-07 LAB — POCT URINE PREGNANCY: Preg Test, Ur: NEGATIVE

## 2013-10-07 NOTE — Patient Instructions (Signed)
Tendinitis °Tendinitis is swelling and inflammation of the tendons. Tendons are band-like tissues that connect muscle to bone. Tendinitis commonly occurs in the:  °· Shoulders (rotator cuff). °· Heels (Achilles tendon). °· Elbows (triceps tendon). °CAUSES °Tendinitis is usually caused by overusing the tendon, muscles, and joints involved. When the tissue surrounding a tendon (synovium) becomes inflamed, it is called tenosynovitis. Tendinitis commonly develops in people whose jobs require repetitive motions. °SYMPTOMS °· Pain. °· Tenderness. °· Mild swelling. °DIAGNOSIS °Tendinitis is usually diagnosed by physical exam. Your caregiver may also order X-rays or other imaging tests. °TREATMENT °Your caregiver may recommend certain medicines or exercises for your treatment. °HOME CARE INSTRUCTIONS  °· Use a sling or splint for as long as directed by your caregiver until the pain decreases. °· Put ice on the injured area. °· Put ice in a plastic bag. °· Place a towel between your skin and the bag. °· Leave the ice on for 15-20 minutes, 03-04 times a day. °· Avoid using the limb while the tendon is painful. Perform gentle range of motion exercises only as directed by your caregiver. Stop exercises if pain or discomfort increase, unless directed otherwise by your caregiver. °· Only take over-the-counter or prescription medicines for pain, discomfort, or fever as directed by your caregiver. °SEEK MEDICAL CARE IF:  °· Your pain and swelling increase. °· You develop new, unexplained symptoms, especially increased numbness in the hands. °MAKE SURE YOU:  °· Understand these instructions. °· Will watch your condition. °· Will get help right away if you are not doing well or get worse. °Document Released: 07/03/2000 Document Revised: 09/28/2011 Document Reviewed: 09/22/2010 °[employer]® Patient Information ©2014 [employer]. ° °

## 2013-10-07 NOTE — Progress Notes (Addendum)
Subjective:    Patient ID: Annette Ashley, female    DOB: 1981-01-23, 33 y.o.   MRN: [id number]  HPI This chart was scribed for Viviann SpareSteven Laurabelle Gorczyca-MD by Smiley HousemanFallon Davis, Scribe. This patient was seen in room 8 and the patient's care was started at 11:59 AM.  HPI Comments: Annette Ashley is a 33 y.o. female who presents to the [facility] complaining of constant right knee pain that started early this morning.  She states the pain radiates into her right foot.  Pt states last week she felt something move in the back of her knee, but denies pain when this occurred.  Pt denies any known injury to the area.  Pt states she started using the stepper as work out, but denies pain while using the machine.  Pt reports when she ambulates it feels like her knee is cracking or popping.  Pt reports she takes Vitamin D3 daily.  Pt denies using birth control and reports she hasn't had a menstrual cycle since August.  Past Surgical History  Procedure Laterality Date  . Tonsillectomy  age 89  . Neck surgery      cyst removed from L anterior neck    Family History  Problem Relation Age of Onset  . Cancer Father     lung cancer  . Heart disease Father   . Diabetes Father   . Kidney disease Father     on dialysis  . Bipolar disorder Brother   . Diabetes Maternal Aunt   . Diabetes Maternal Uncle   . Diabetes Paternal Aunt   . Diabetes Paternal Uncle   . Diabetes Maternal Grandmother   . Diabetes Paternal Grandmother   . Breast cancer Cousin     History   Social History  . Marital Status: Married    Spouse Name: Annette Ashley    Number of Children: 0  . Years of Education: 10   Occupational History  . homemaker    Social History Main Topics  . Smoking status: Former Smoker    Types: Cigarettes    Quit date: 09/28/2009  . Smokeless tobacco: Never Used  . Alcohol Use: No  . Drug Use: No  . Sexual Activity: Yes    Partners: Male    Birth Control/ Protection: None     Comment:  infertility work-up found no cause   Other Topics Concern  . Not on file   Social History Narrative   Lives with husband, 2 dogs   Completed the 10th grade, then obtained her GED. [place]      Allergies  Allergen Reactions  . Clindamycin/Lincomycin Rash    Oral rash    Patient Active Problem List   Diagnosis Date Noted  . Unspecified vitamin D deficiency 08/12/2012  . Morbid obesity with BMI of 40.0-44.9, adult 08/11/2012  . Depressive disorder, not elsewhere classified 04/28/2012    Review of Systems  Constitutional: Negative for fever and chills.  Musculoskeletal: Positive for arthralgias (Right knee). Negative for gait problem and joint swelling.  Skin: Negative for color change and rash.      Objective:   Physical Exam Nursing note and vitals reviewed. Constitutional: She is oriented to person, place, and time. She appears well-developed and well-nourished. No distress.  HENT:  Head: Normocephalic and atraumatic.  Eyes: EOM are normal.  Neck: Neck supple. No tracheal deviation present.  Cardiovascular: Normal rate.   Pulmonary/Chest: Effort normal. No respiratory distress.  Musculoskeletal:  Right knee: Tenderness found.  Examination of right knee: No joint instability.  She has pain with flexion and extension of the knee.  Tender over the quadriceps attached to the patellar tendon.    Neurological: She is alert and oriented to person, place, and time.  Skin: Skin is warm and dry.  Psychiatric: She has a normal mood and affect. Her behavior is normal.   Filed Vitals:   10/07/13 1147  BP: 128/84  Pulse: 82  Temp: 98.2 F (36.8 C)  TempSrc: Oral  Resp: 18  Height: 5\' 2"  (1.575 m)  Weight: 222 lb 12.8 oz (101.061 kg)  SpO2: 98%   DIAGNOSTIC STUDIES: Oxygen Saturation is 98% on RA, normal by my interpretation.    COORDINATION OF CARE: 12:05 PM-Will order pregnancy screen.  Will order x-ray of right knee.   Patient informed of current plan of treatment and evaluation and agrees with plan.    Results for orders placed in visit on 10/07/13  POCT URINE PREGNANCY      Result Value Ref Range   Preg Test, Ur Negative    [facility] reading (PRIMARY) by  Dr. Cleta Alberts knee films are normal        Assessment & Plan:    hold off on exercise. She is to ice the knee for 10 minutes 3 times a day. She is advised to take Aleve 2 twice a day. If she continues to have trouble we'll make referral to an orthopedist or schedule MRI.  I personally performed the services described in this documentation, which was scribed in my presence. The recorded information has been reviewed and is accurate.

## 2014-03-04 ENCOUNTER — Emergency Department (INDEPENDENT_AMBULATORY_CARE_PROVIDER_SITE_OTHER)
Admission: EM | Admit: 2014-03-04 | Discharge: 2014-03-04 | Disposition: A | Discharge status: Home / Self Care | Payer: Self-pay | Source: Home / Self Care | Attending: Emergency Medicine | Admitting: Emergency Medicine

## 2014-03-04 ENCOUNTER — Encounter (HOSPITAL_COMMUNITY): Payer: Self-pay | Admitting: Emergency Medicine

## 2014-03-04 ENCOUNTER — Emergency Department (INDEPENDENT_AMBULATORY_CARE_PROVIDER_SITE_OTHER): Payer: BC Managed Care – PPO

## 2014-03-04 DIAGNOSIS — S6391XA Sprain of unspecified part of right wrist and hand, initial encounter: Secondary | ICD-10-CM

## 2014-03-04 DIAGNOSIS — S6390XA Sprain of unspecified part of unspecified wrist and hand, initial encounter: Secondary | ICD-10-CM

## 2014-03-04 NOTE — ED Notes (Signed)
Patient c/o right hand injury onset Friday. Patient reports she was trying to hold a garbage can and twisted her hand and felt a pop. Patient reports pain near her right index finger. Patient is alert and oriented and in no acute distress.

## 2014-03-04 NOTE — Discharge Instructions (Signed)
Your xrays were normal. You have sprained your hand/wrist and this will heal on it's own over the next 1-2 weeks. Use tylenol or ibuprofen as directed on packaging for discomfort and splint as needed for comfort during the day. If symptoms do not improve over the next 2 weeks, please follow up with your doctor.

## 2014-03-04 NOTE — ED Provider Notes (Signed)
CSN: [id number]     Arrival date & time 03/04/14  0910 History   First MD Initiated Contact with Patient 03/04/14 [phone]     Chief Complaint  Patient presents with  . Hand Injury   (Consider location/radiation/quality/duration/timing/severity/associated sxs/prior Treatment) Patient is a 33 y.o. female presenting with hand injury. The history is provided by the patient.  Hand Injury Location:  Hand Time since incident:  3 days Injury: yes   Mechanism of injury comment:  Was cleaning out large trash can and can fell over while she was holding handle and suffered a twisting injury to right hand Hand location:  Dorsum of R hand Pain details:    Quality:  Aching Dislocation: no   Prior injury to area:  No Worsened by:  Movement   Past Medical History  Diagnosis Date  . Depression   . Infertility, female    Past Surgical History  Procedure Laterality Date  . Tonsillectomy  age 74  . Neck surgery      cyst removed from L anterior neck   Family History  Problem Relation Age of Onset  . Cancer Father     lung cancer  . Heart disease Father   . Diabetes Father   . Kidney disease Father     on dialysis  . Bipolar disorder Brother   . Diabetes Maternal Aunt   . Diabetes Maternal Uncle   . Diabetes Paternal Aunt   . Diabetes Paternal Uncle   . Diabetes Maternal Grandmother   . Diabetes Paternal Grandmother   . Breast cancer Cousin    History  Substance Use Topics  . Smoking status: Former Smoker    Types: Cigarettes    Quit date: 09/28/2009  . Smokeless tobacco: Never Used  . Alcohol Use: No   OB History   Grav Para Term Preterm Abortions TAB SAB Ect Mult Living   0 0 0 0 0 0 0 0 0 0      Review of Systems  All other systems reviewed and are negative.   Allergies  Clindamycin/lincomycin  Home Medications   Prior to Admission medications   Medication Sig Start Date End Date Taking? Authorizing Provider  cholecalciferol (VITAMIN D) 1000 UNITS tablet Take 1,000  Units by mouth daily.    Historical Provider, MD  sulfamethoxazole-trimethoprim (BACTRIM DS) 800-160 MG per tablet Take 1 tablet by mouth 2 (two) times daily. 07/21/13   Kermit Balo Tysinger, PA-C   BP 130/95  Pulse 75  Temp(Src) 98.2 F (36.8 C) (Oral)  Resp 14  SpO2 96% Physical Exam  Nursing note and vitals reviewed. Constitutional: She is oriented to person, place, and time. She appears well-developed and well-nourished. No distress.  HENT:  Head: Normocephalic and atraumatic.  Eyes: Conjunctivae are normal. No scleral icterus.  Cardiovascular: Normal rate.   Pulmonary/Chest: Effort normal.  Musculoskeletal:       Right hand: She exhibits tenderness. She exhibits normal range of motion, no bony tenderness, normal capillary refill, no deformity, no laceration and no swelling. Normal sensation noted. Normal strength noted.       Hands: Neurological: She is alert and oriented to person, place, and time.  Skin: Skin is warm and dry.  Psychiatric: She has a normal mood and affect. Her behavior is normal.    ED Course  Procedures (including critical care time) Labs Review Labs Reviewed - No data to display  Imaging Review No results found.   MDM   1. Hand sprain, right, initial encounter  Films negative for acute injury. Likely mild sprain. Will advise ice, ibuprofen and splint as needed for comfort with PCP follow up if no improvement over the next 1-2 weeks.    Ria ClockJennifer Lee H Suzzette Gasparro, [place] 03/04/14 1115

## 2014-03-05 NOTE — ED Provider Notes (Signed)
Medical screening examination/treatment/procedure(s) were performed by non-physician practitioner and as supervising physician I was immediately available for consultation/collaboration.  Leslee Homeavid Koleson Reifsteck, M.D.  Reuben Likesavid C Raylene Carmickle, MD 03/05/14 [phone]

## 2014-07-03 ENCOUNTER — Telehealth: Payer: Self-pay | Admitting: Internal Medicine

## 2014-07-03 NOTE — Telephone Encounter (Signed)
[facility] @ [phone] on 12/14

## 2015-03-28 ENCOUNTER — Other Ambulatory Visit (INDEPENDENT_AMBULATORY_CARE_PROVIDER_SITE_OTHER): Payer: BLUE CROSS/BLUE SHIELD

## 2015-03-28 DIAGNOSIS — Z23 Encounter for immunization: Secondary | ICD-10-CM

## 2015-10-02 ENCOUNTER — Other Ambulatory Visit: Payer: Self-pay | Admitting: Surgery

## 2015-10-02 DIAGNOSIS — R222 Localized swelling, mass and lump, trunk: Secondary | ICD-10-CM

## 2015-10-08 ENCOUNTER — Ambulatory Visit
Admission: RE | Admit: 2015-10-08 | Discharge: 2015-10-08 | Disposition: A | Discharge status: Home / Self Care | Payer: BLUE CROSS/BLUE SHIELD | Source: Ambulatory Visit | Attending: Surgery | Admitting: Surgery

## 2015-10-08 DIAGNOSIS — R222 Localized swelling, mass and lump, trunk: Secondary | ICD-10-CM

## 2015-10-08 MED ORDER — IOPAMIDOL (ISOVUE-300) INJECTION 61%
75.0000 mL | Freq: Once | INTRAVENOUS | Status: AC | PRN
  Administered 2015-10-08: 75 mL via INTRAVENOUS

## 2018-07-07 ENCOUNTER — Encounter: Payer: Self-pay | Admitting: Emergency Medicine

## 2018-07-07 ENCOUNTER — Ambulatory Visit
Admission: EM | Admit: 2018-07-07 | Discharge: 2018-07-07 | Disposition: A | Discharge status: Home / Self Care | Payer: BLUE CROSS/BLUE SHIELD | Attending: Emergency Medicine | Admitting: Emergency Medicine

## 2018-07-07 DIAGNOSIS — J019 Acute sinusitis, unspecified: Secondary | ICD-10-CM | POA: Insufficient documentation

## 2018-07-07 MED ORDER — AMOXICILLIN-POT CLAVULANATE 875-125 MG PO TABS: 1.0000 | ORAL_TABLET | Freq: Two times a day (BID) | ORAL | 0 refills | Status: AC

## 2018-07-07 MED ORDER — PREDNISONE 50 MG PO TABS: 50.0000 mg | ORAL_TABLET | Freq: Every day | ORAL | 0 refills | Status: DC

## 2018-07-07 NOTE — ED Notes (Signed)
Patient able to ambulate independently  

## 2018-07-07 NOTE — Discharge Instructions (Signed)
I am treating you for a sinus infection Please begin Augmentin twice daily for the next week I recommend you having food on your stomach, this medicine often causes diarrhea/upset stomach Begin prednisone daily with breakfast  May continue over-the-counter symptomatic relief as the antibiotic begins to work  Please continue to monitor your symptoms, follow-up if developing fever, shortness of breath, difficulty breathing, symptoms not improving with treatment.

## 2018-07-07 NOTE — ED Provider Notes (Signed)
[name]    CSN: [id number] Arrival date & time: 07/07/18  1050     History   Chief Complaint Chief Complaint  Patient presents with  . URI    HPI Annette Ashley is a 37 y.o. female history of depression, previous tonsillectomy presenting today for evaluation of URI symptoms.  Patient has had nasal congestion, dry cough as well as postnasal drainage.  She has had occasional sore throat.  Notices symptoms worse at night and having difficulty breathing due to feeling stuffed up.  Denies current tobacco use, quit in 2011.  Occasional nausea and vomiting, more prominent at night.  Symptoms began 12/6 and have been persistent for the past 13 days.  HPI  Past Medical History:  Diagnosis Date  . Depression   . Infertility, female     Patient Active Problem List   Diagnosis Date Noted  . Unspecified vitamin D deficiency 08/12/2012  . Morbid obesity with BMI of 40.0-44.9, adult (HCC) 08/11/2012  . Depressive disorder, not elsewhere classified 04/28/2012    Past Surgical History:  Procedure Laterality Date  . NECK SURGERY     cyst removed from L anterior neck  . TONSILLECTOMY  age 39    OB History    Gravida  0   Para  0   Term  0   Preterm  0   AB  0   Living  0     SAB  0   TAB  0   Ectopic  0   Multiple  0   Live Births               Home Medications    Prior to Admission medications   Medication Sig Start Date End Date Taking? Authorizing Provider  amoxicillin-clavulanate (AUGMENTIN) 875-125 MG tablet Take 1 tablet by mouth every 12 (twelve) hours for 7 days. 07/07/18 07/14/18  Shah Insley C, PA-C  predniSONE (DELTASONE) 50 MG tablet Take 1 tablet (50 mg total) by mouth daily with breakfast. 07/07/18   Atthew Coutant, Junius Creamer, PA-C    Family History Family History  Problem Relation Age of Onset  . Cancer Father        lung cancer  . Heart disease Father   . Diabetes Father   . Kidney disease Father        on dialysis    . Bipolar disorder Brother   . Breast cancer Cousin   . Diabetes Maternal Aunt   . Diabetes Maternal Uncle   . Diabetes Paternal Aunt   . Diabetes Paternal Uncle   . Diabetes Maternal Grandmother   . Diabetes Paternal Grandmother     Social History Social History   Tobacco Use  . Smoking status: Former Smoker    Types: Cigarettes    Last attempt to quit: 09/28/2009    Years since quitting: 8.7  . Smokeless tobacco: Never Used  Substance Use Topics  . Alcohol use: No  . Drug use: No     Allergies   Clindamycin/lincomycin   Review of Systems Review of Systems  Constitutional: Negative for activity change, appetite change, chills, fatigue and fever.  HENT: Positive for congestion, rhinorrhea, sinus pressure and sore throat. Negative for ear pain and trouble swallowing.   Eyes: Negative for discharge and redness.  Respiratory: Positive for cough. Negative for chest tightness and shortness of breath.   Cardiovascular: Negative for chest pain.  Gastrointestinal: Negative for abdominal pain, diarrhea, nausea and vomiting.  Musculoskeletal: Negative for myalgias.  Skin: Negative for rash.  Neurological: Negative for dizziness, light-headedness and headaches.     Physical Exam Triage Vital Signs ED Triage Vitals  Enc Vitals Group     BP 07/07/18 1103 (!) 131/95     Pulse Rate 07/07/18 1103 84     Resp 07/07/18 1103 16     Temp 07/07/18 1103 98.1 F (36.7 C)     Temp Source 07/07/18 1103 Oral     SpO2 07/07/18 1103 98 %     Weight --      Height --      Head Circumference --      Peak Flow --      Pain Score 07/07/18 1104 0     Pain Loc --      Pain Edu? --      Excl. in GC? --    No data found.  Updated Vital Signs BP (!) 131/95 (BP Location: Right Arm)   Pulse 84   Temp 98.1 F (36.7 C) (Oral)   Resp 16   LMP 06/30/2018   SpO2 98%   Visual Acuity Right Eye Distance:   Left Eye Distance:   Bilateral Distance:    Right Eye Near:   Left Eye Near:     Bilateral Near:     Physical Exam Vitals signs and nursing note reviewed.  Constitutional:      General: She is not in acute distress.    Appearance: She is well-developed.  HENT:     Head: Normocephalic and atraumatic.     Ears:     Comments: Bilateral ears without tenderness to palpation of external auricle, tragus and mastoid, EAC's without erythema or swelling, TM's with good bony landmarks and cone of light. Non erythematous.    Mouth/Throat:     Comments: Oral mucosa pink and moist, no tonsillar enlargement or exudate. Posterior pharynx patent and nonerythematous, no uvula deviation or swelling. Normal phonation. Eyes:     Conjunctiva/sclera: Conjunctivae normal.  Neck:     Musculoskeletal: Neck supple.  Cardiovascular:     Rate and Rhythm: Normal rate and regular rhythm.     Heart sounds: No murmur.  Pulmonary:     Effort: Pulmonary effort is normal. No respiratory distress.     Breath sounds: Normal breath sounds.     Comments: Breathing comfortably at rest, CTABL, no wheezing, rales or other adventitious sounds auscultated Abdominal:     Palpations: Abdomen is soft.     Tenderness: There is no abdominal tenderness.  Skin:    General: Skin is warm and dry.  Neurological:     Mental Status: She is alert.      UC Treatments / Results  Labs (all labs ordered are listed, but only abnormal results are displayed) Labs Reviewed - No data to display  EKG None  Radiology No results found.  Procedures Procedures (including critical care time)  Medications Ordered in UC Medications - No data to display  Initial Impression / Assessment and Plan / UC Course  I have reviewed the triage vital signs and the nursing notes.  Pertinent labs & imaging results that were available during my care of the patient were reviewed by me and considered in my medical decision making (see chart for details).     URI symptoms x13 days, vital signs stable, exam nonfocal.  Will  treat patient for acute sinusitis given length of symptoms.  Will provide Augmentin and prednisone.  Continue over-the-counter medicines for further symptomatic management.  Patient is to see an allergist soon and would like to refrain from any allergy medications.Discussed strict return precautions. Patient verbalized understanding and is agreeable with plan.  Final Clinical Impressions(s) / UC Diagnoses   Final diagnoses:  Acute sinusitis with symptoms greater than 10 days     Discharge Instructions     I am treating you for a sinus infection Please begin Augmentin twice daily for the next week I recommend you having food on your stomach, this medicine often causes diarrhea/upset stomach Begin prednisone daily with breakfast  May continue over-the-counter symptomatic relief as the antibiotic begins to work  Please continue to monitor your symptoms, follow-up if developing fever, shortness of breath, difficulty breathing, symptoms not improving with treatment.   ED Prescriptions    Medication Sig Dispense Auth. Provider   amoxicillin-clavulanate (AUGMENTIN) 875-125 MG tablet Take 1 tablet by mouth every 12 (twelve) hours for 7 days. 14 tablet Jermell Holeman C, PA-C   predniSONE (DELTASONE) 50 MG tablet Take 1 tablet (50 mg total) by mouth daily with breakfast. 5 tablet Toula Miyasaki C, PA-C     Controlled Substance Prescriptions [place] Controlled Substance Registry consulted? Not Applicable   Lew DawesWieters, Teleah Villamar C, [place]PA-C 07/07/18 1145

## 2018-07-07 NOTE — ED Triage Notes (Signed)
[facility], dry cough, post-nasal drainage.

## 2018-07-14 ENCOUNTER — Emergency Department (HOSPITAL_COMMUNITY): Payer: BLUE CROSS/BLUE SHIELD

## 2018-07-14 ENCOUNTER — Emergency Department (HOSPITAL_COMMUNITY)
Admission: EM | Admit: 2018-07-14 | Discharge: 2018-07-14 | Disposition: A | Discharge status: Home / Self Care | Payer: BLUE CROSS/BLUE SHIELD | Attending: Emergency Medicine | Admitting: Emergency Medicine

## 2018-07-14 ENCOUNTER — Other Ambulatory Visit: Payer: Self-pay

## 2018-07-14 DIAGNOSIS — R0789 Other chest pain: Secondary | ICD-10-CM | POA: Insufficient documentation

## 2018-07-14 DIAGNOSIS — M546 Pain in thoracic spine: Secondary | ICD-10-CM | POA: Diagnosis not present

## 2018-07-14 DIAGNOSIS — Z87891 Personal history of nicotine dependence: Secondary | ICD-10-CM | POA: Insufficient documentation

## 2018-07-14 LAB — CBC
HCT: 40.6 % (ref 36.0–46.0)
Hemoglobin: 12.8 g/dL (ref 12.0–15.0)
MCH: 27.9 pg (ref 26.0–34.0)
MCHC: 31.5 g/dL (ref 30.0–36.0)
MCV: 88.6 fL (ref 80.0–100.0)
Platelets: 355 10*3/uL (ref 150–400)
RBC: 4.58 MIL/uL (ref 3.87–5.11)
RDW: 12.4 % (ref 11.5–15.5)
WBC: 11.7 10*3/uL — ABNORMAL HIGH (ref 4.0–10.5)
nRBC: 0 % (ref 0.0–0.2)

## 2018-07-14 LAB — BASIC METABOLIC PANEL
Anion gap: 6 (ref 5–15)
BUN: 10 mg/dL (ref 6–20)
CO2: 28 mmol/L (ref 22–32)
Calcium: 9.1 mg/dL (ref 8.9–10.3)
Chloride: 103 mmol/L (ref 98–111)
Creatinine, Ser: 0.67 mg/dL (ref 0.44–1.00)
GFR calc Af Amer: >60 mL/min (ref >60)
GFR calc non Af Amer: >60 mL/min (ref >60)
Glucose, Bld: 90 mg/dL (ref 70–99)
Potassium: 3.9 mmol/L (ref 3.5–5.1)
Sodium: 137 mmol/L (ref 135–145)

## 2018-07-14 LAB — I-STAT TROPONIN, ED
Troponin i, poc: 0 ng/mL (ref 0.00–0.08)
Troponin i, poc: 0 ng/mL (ref 0.00–0.08)

## 2018-07-14 LAB — I-STAT BETA HCG BLOOD, ED (MC, WL, AP ONLY): I-stat hCG, quantitative: <5 m[IU]/mL (ref <5)

## 2018-07-14 LAB — APTT: aPTT: 30 seconds (ref 24–36)

## 2018-07-14 MED ORDER — IOPAMIDOL (ISOVUE-370) INJECTION 76%
INTRAVENOUS | Status: AC
  Administered 2018-07-14: 100 mL
  Filled 2018-07-14: qty 100

## 2018-07-14 MED ORDER — ASPIRIN 81 MG PO CHEW
324.0000 mg | CHEWABLE_TABLET | Freq: Once | ORAL | Status: AC
  Administered 2018-07-14: 324 mg via ORAL
  Filled 2018-07-14: qty 4

## 2018-07-14 MED ORDER — OXYCODONE-ACETAMINOPHEN 5-325 MG PO TABS
1.0000 | ORAL_TABLET | Freq: Once | ORAL | Status: AC
  Administered 2018-07-14: 1 via ORAL
  Filled 2018-07-14: qty 1

## 2018-07-14 NOTE — Discharge Instructions (Addendum)
Follow-up with your primary care doctor tomorrow as currently scheduled.  Return to ER for new or worsening symptoms.

## 2018-07-14 NOTE — ED Triage Notes (Signed)
Pt presents to ED for evaluation of intermittent left upper back pain that makes it hard for her to take a deep breath onset 0930 this morning, denies injury. Has not taken anything for pain relief. On antibiotics for URI.

## 2018-07-14 NOTE — ED Notes (Signed)
Pt verbalizes understanding of d/c instructions. Pt ambulatory at d/c with all belongings and with family.   

## 2018-07-14 NOTE — ED Provider Notes (Signed)
[facility] EMERGENCY DEPARTMENT Provider Note   CSN: [id number] Arrival date & time: 07/14/18  1143     History   Chief Complaint Chief Complaint  Patient presents with  . Back Pain  . Shortness of Breath    HPI Annette Ashley is a 37 y.o. female.  37 year old female presents with complaint of pain in her back, left CVA area which radiates up towards her upper back around the front of her left shoulder, onset 9:30AM today after going to the bathroom.  Pain is sharp/stabbing/dull/aching/throbbing in nature, "takes my breath away," intermittent and lasts for a few seconds at a time.  Nothing makes pain better or worse.  Associated with nausea.  Denies sweats, chills, vomiting, changes in bowel or bladder habits.  Denies history of similar pain previously.  Patient reports currently being worked up for possible diabetes, denies history of high blood pressure high cholesterol.  Family history significant for MI father in his 5s (deceased).  Patient is a former smoker, quit smoking 8 years ago.  Patient is currently on Augmentin for a sinus infection, also reports she has frequent kidney stones.  No other complaints or concerns.     Past Medical History:  Diagnosis Date  . Depression   . Infertility, female     Patient Active Problem List   Diagnosis Date Noted  . Unspecified vitamin D deficiency 08/12/2012  . Morbid obesity with BMI of 40.0-44.9, adult (HCC) 08/11/2012  . Depressive disorder, not elsewhere classified 04/28/2012    Past Surgical History:  Procedure Laterality Date  . NECK SURGERY     cyst removed from L anterior neck  . TONSILLECTOMY  age 66     OB History    Gravida  0   Para  0   Term  0   Preterm  0   AB  0   Living  0     SAB  0   TAB  0   Ectopic  0   Multiple  0   Live Births               Home Medications    Prior to Admission medications   Medication Sig Start Date End Date Taking? Authorizing  Provider  amoxicillin-clavulanate (AUGMENTIN) 875-125 MG tablet Take 1 tablet by mouth every 12 (twelve) hours for 7 days. 07/07/18 07/14/18 Yes Wieters, Hallie C, PA-C  predniSONE (DELTASONE) 50 MG tablet Take 1 tablet (50 mg total) by mouth daily with breakfast. Patient not taking: Reported on 07/14/2018 07/07/18   Lew Dawes, PA-C    Family History Family History  Problem Relation Age of Onset  . Cancer Father        lung cancer  . Heart disease Father   . Diabetes Father   . Kidney disease Father        on dialysis  . Bipolar disorder Brother   . Breast cancer Cousin   . Diabetes Maternal Aunt   . Diabetes Maternal Uncle   . Diabetes Paternal Aunt   . Diabetes Paternal Uncle   . Diabetes Maternal Grandmother   . Diabetes Paternal Grandmother     Social History Social History   Tobacco Use  . Smoking status: Former Smoker    Types: Cigarettes    Last attempt to quit: 09/28/2009    Years since quitting: 8.7  . Smokeless tobacco: Never Used  Substance Use Topics  . Alcohol use: No  . Drug use: No  Allergies   Clindamycin/lincomycin   Review of Systems Review of Systems  Constitutional: Negative for chills, diaphoresis and fever.  Respiratory: Positive for shortness of breath.   Cardiovascular: Positive for chest pain. Negative for palpitations and leg swelling.  Gastrointestinal: Positive for nausea. Negative for abdominal pain, constipation, diarrhea and vomiting.  Musculoskeletal: Positive for back pain. Negative for myalgias and neck pain.  Skin: Negative for rash and wound.  Allergic/Immunologic: Negative for immunocompromised state.  Hematological: Does not bruise/bleed easily.  Psychiatric/Behavioral: Negative for confusion.  All other systems reviewed and are negative.    Physical Exam Updated Vital Signs BP 116/73   Pulse 76   Temp 98 F (36.7 C) (Oral)   Resp 19   Ht 5\' 3"  (1.6 m)   Wt 99.8 kg   LMP 06/30/2018   SpO2 99%   BMI  38.97 kg/m   Physical Exam Vitals signs and nursing note reviewed.  Constitutional:      General: She is not in acute distress.    Appearance: She is well-developed. She is not diaphoretic.  HENT:     Head: Normocephalic and atraumatic.  Neck:     Musculoskeletal: Normal range of motion and neck supple.  Cardiovascular:     Rate and Rhythm: Normal rate and regular rhythm.     Pulses: Normal pulses.  Pulmonary:     Effort: Pulmonary effort is normal.     Breath sounds: Normal breath sounds. No decreased breath sounds or wheezing.  Chest:     Chest wall: No deformity, tenderness or crepitus.  Abdominal:     Palpations: Abdomen is soft.     Tenderness: There is no abdominal tenderness.  Musculoskeletal:     Cervical back: She exhibits no tenderness.     Thoracic back: She exhibits no tenderness.     Lumbar back: She exhibits no tenderness.       Back:     Right lower leg: She exhibits no tenderness. No edema.     Left lower leg: She exhibits no tenderness. No edema.  Skin:    General: Skin is warm and dry.  Neurological:     Mental Status: She is alert and oriented to person, place, and time.  Psychiatric:        Behavior: Behavior normal.      ED Treatments / Results  Labs (all labs ordered are listed, but only abnormal results are displayed) Labs Reviewed  CBC - Abnormal; Notable for the following components:      Result Value   WBC 11.7 (*)    All other components within normal limits  BASIC METABOLIC PANEL  APTT  I-STAT TROPONIN, ED  I-STAT BETA HCG BLOOD, ED ([facility], [facility], [facility] ONLY)  I-STAT TROPONIN, ED    EKG EKG Interpretation  Date/Time:  Thursday July 14 2018 11:52:55 EST Ventricular Rate:  72 PR Interval:    QRS Duration: 87 QT Interval:  391 QTC Calculation: 428 R Axis:   69 Text Interpretation:  Sinus rhythm Borderline short PR interval Low voltage, precordial leads Borderline T abnormalities, anterior leads Baseline wander in lead(s) II III aVF  V3 V4 V5 V6 No old tracing to compare Confirmed by Linwood DibblesKnapp, Jon [phone]([id number]) on 07/14/2018 11:55:57 AM   Radiology Ct Angio Chest Pe W/cm &/or Wo Cm  Result Date: 07/14/2018 CLINICAL DATA:  Shortness of breath and chest pain EXAM: CT ANGIOGRAPHY CHEST WITH CONTRAST TECHNIQUE: Multidetector CT imaging of the chest was performed using the standard protocol during bolus  administration of intravenous contrast. Multiplanar CT image reconstructions and MIPs were obtained to evaluate the vascular anatomy. CONTRAST:  100mL ISOVUE-370 IOPAMIDOL (ISOVUE-370) INJECTION 76% COMPARISON:  Chest CT October 08, 2015 and chest radiograph July 14, 2018 FINDINGS: Cardiovascular: There is no demonstrable pulmonary embolus. There is no thoracic aortic aneurysm or dissection. Visualized great vessels appear unremarkable. Right innominate and left common carotid arteries arise as a common trunk, an anatomic variant. There is no pericardial effusion or pericardial thickening. Mediastinum/Nodes: Visualized thyroid appears unremarkable. There is no appreciable thoracic adenopathy. No esophageal lesions are appreciable. Lungs/Pleura: There is no evident edema or consolidation. No pleural effusion or pleural thickening evident. Upper Abdomen: Visualized upper abdominal structures appear unremarkable. Musculoskeletal: There are no blastic or lytic bone lesions. No chest wall lesions are appreciable. Review of the MIP images confirms the above findings. IMPRESSION: 1. No demonstrable pulmonary embolus. No thoracic aortic aneurysm or dissection. 2.  Lungs clear. 3.  No demonstrable thoracic adenopathy. Electronically Signed   By: Bretta BangWilliam  Woodruff III M.D.   On: 07/14/2018 14:19   Dg Chest Port 1 View  Result Date: 07/14/2018 CLINICAL DATA:  Chest and back pain.  Difficulty breathing. EXAM: PORTABLE CHEST 1 VIEW COMPARISON:  CT 10/08/2015 FINDINGS: Heart and mediastinal contours are within normal limits. No focal opacities or effusions. No  acute bony abnormality. IMPRESSION: No active disease. Electronically Signed   By: Charlett NoseKevin  Dover M.D.   On: 07/14/2018 12:47    Procedures Procedures (including critical care time)  Medications Ordered in ED Medications  aspirin chewable tablet 324 mg (324 mg Oral Given 07/14/18 1248)  iopamidol (ISOVUE-370) 76 % injection (100 mLs  Contrast Given 07/14/18 1400)  oxyCODONE-acetaminophen (PERCOCET/ROXICET) 5-325 MG per tablet 1 tablet (1 tablet Oral Given 07/14/18 1528)     Initial Impression / Assessment and Plan / ED Course  I have reviewed the triage vital signs and the nursing notes.  Pertinent labs & imaging results that were available during my care of the patient were reviewed by me and considered in my medical decision making (see chart for details).  Clinical Course as of Jul 14 1550  Thu Jul 14, 2018  1540 37 yo female with complaint of pain in her back radiating up to her left shoulder, worse with deep inspiration. Heart score 3, initial trop negative, CTA negative for Pe/ dissection. CBC, BMP, CXR without acute findings. If repeat trop is negative patient will be dc. She is scheduled to see her PCP tomorrow. Return to ER for new or worsening symptoms.    [LM]    Clinical Course User Index [LM] Jeannie FendMurphy, Jerriah Ines A, PA-C   Final Clinical Impressions(s) / ED Diagnoses   Final diagnoses:  Atypical chest pain  Acute left-sided thoracic back pain    ED Discharge Orders    None       Jeannie FendMurphy, Genette Huertas A, PA-C 07/14/18 1551    Linwood DibblesKnapp, Jon, MD 07/15/18 [id number]

## 2018-09-02 ENCOUNTER — Ambulatory Visit
Admission: EM | Admit: 2018-09-02 | Discharge: 2018-09-02 | Disposition: A | Discharge status: Home / Self Care | Payer: BLUE CROSS/BLUE SHIELD

## 2018-09-02 ENCOUNTER — Encounter: Payer: Self-pay | Admitting: Emergency Medicine

## 2018-09-02 DIAGNOSIS — Z3202 Encounter for pregnancy test, result negative: Secondary | ICD-10-CM

## 2018-09-02 DIAGNOSIS — B349 Viral infection, unspecified: Secondary | ICD-10-CM | POA: Diagnosis not present

## 2018-09-02 DIAGNOSIS — R112 Nausea with vomiting, unspecified: Secondary | ICD-10-CM | POA: Diagnosis not present

## 2018-09-02 DIAGNOSIS — R519 Headache, unspecified: Secondary | ICD-10-CM

## 2018-09-02 DIAGNOSIS — R51 Headache: Secondary | ICD-10-CM | POA: Diagnosis not present

## 2018-09-02 LAB — POCT URINE PREGNANCY: PREG TEST UR: NEGATIVE

## 2018-09-02 MED ORDER — ONDANSETRON 4 MG PO TBDP: 4.0000 mg | ORAL_TABLET | Freq: Three times a day (TID) | ORAL | 0 refills | Status: DC | PRN

## 2018-09-02 MED ORDER — KETOROLAC TROMETHAMINE 15 MG/ML IJ SOLN
15.0000 mg | Freq: Once | INTRAMUSCULAR | Status: AC
  Administered 2018-09-02: 15 mg via INTRAMUSCULAR

## 2018-09-02 MED ORDER — METOCLOPRAMIDE HCL 5 MG/ML IJ SOLN
5.0000 mg | Freq: Once | INTRAMUSCULAR | Status: AC
  Administered 2018-09-02: 5 mg via INTRAMUSCULAR

## 2018-09-02 MED ORDER — DEXAMETHASONE SODIUM PHOSPHATE 10 MG/ML IJ SOLN
10.0000 mg | Freq: Once | INTRAMUSCULAR | Status: AC
  Administered 2018-09-02: 10 mg via INTRAMUSCULAR

## 2018-09-02 NOTE — ED Notes (Signed)
Patient able to ambulate independently  

## 2018-09-02 NOTE — ED Triage Notes (Signed)
Pt presents to [facility] for assessment of cough, congestion, sore throat, and bilateral ear pain x 1 week.  States yesterday she was having headache and nausea with one episode of "violent" emesis.

## 2018-09-02 NOTE — Discharge Instructions (Signed)
No alarming signs on exam.  Urine pregnancy negative.  Toradol, Reglan, Decadron injection in office today.  I have also called in Zofran in case of continued nausea/vomiting. Keep hydrated, urine should be clear to pale yellow in color.  Follow-up with PCP for further evaluation if symptoms do not improving.  If experiencing worsening symptoms, acute worsening of headache, worse headache of your life, nausea/vomiting not controlled by medication, weakness, dizziness, confusion, passing out, go to emergency department for further evaluation.

## 2018-09-02 NOTE — ED Provider Notes (Signed)
[name]    CSN: [id number] Arrival date & time: 09/02/18  1100     History   Chief Complaint Chief Complaint  Patient presents with  . Nausea  . Emesis    HPI Annette Ashley is a 38 y.o. female.   38 year old female comes in for evaluation of headache, nausea, vomiting starting yesterday. States headache goes from neck to the head, mostly right sided, but can be bilateral. She has nausea with one episode of vomiting. She has since then been able to tolerate oral intake. She has had 1 week history of URI symptoms with cough, congestion, sore throat, bilateral ear pain. This is improving. Denies fever, chills, night sweats. Denies chest pain, shortness of breath, wheezing. Denies weakness, dizziness, syncope. Denies abdominal pain, diarrhea. LMP 07/28/2018, sexually active without birth control. She has history of PCOS.      Past Medical History:  Diagnosis Date  . Depression   . Infertility, female     Patient Active Problem List   Diagnosis Date Noted  . Unspecified vitamin D deficiency 08/12/2012  . Morbid obesity with BMI of 40.0-44.9, adult ([facility]CC) 08/11/2012  . Depressive disorder, not elsewhere classified 04/28/2012    Past Surgical History:  Procedure Laterality Date  . NECK SURGERY     cyst removed from L anterior neck  . TONSILLECTOMY  age 22    OB History    Gravida  0   Para  0   Term  0   Preterm  0   AB  0   Living  0     SAB  0   TAB  0   Ectopic  0   Multiple  0   Live Births               Home Medications    Prior to Admission medications   Medication Sig Start Date End Date Taking? Authorizing Provider  cetirizine (ZYRTEC) 10 MG tablet Take 10 mg by mouth daily.   Yes [provider]  FLUoxetine (PROZAC) 40 MG capsule Take 40 mg by mouth daily.   Yes [provider]  ondansetron (ZOFRAN ODT) 4 MG disintegrating tablet Take 1 tablet (4 mg total) by mouth every 8 (eight) hours as needed for  nausea or vomiting. 09/02/18   Belinda Fisher, PA-C    Family History Family History  Problem Relation Age of Onset  . Cancer Father        lung cancer  . Heart disease Father   . Diabetes Father   . Kidney disease Father        on dialysis  . Bipolar disorder Brother   . Breast cancer Cousin   . Diabetes Maternal Aunt   . Diabetes Maternal Uncle   . Diabetes Paternal Aunt   . Diabetes Paternal Uncle   . Diabetes Maternal Grandmother   . Diabetes Paternal Grandmother     Social History Social History   Tobacco Use  . Smoking status: Former Smoker    Types: Cigarettes    Last attempt to quit: 09/28/2009    Years since quitting: 8.9  . Smokeless tobacco: Never Used  Substance Use Topics  . Alcohol use: No  . Drug use: No     Allergies   Clindamycin/lincomycin   Review of Systems Review of Systems  Reason unable to perform ROS: See HPI as above.     Physical Exam Triage Vital Signs ED Triage Vitals  Enc Vitals Group  BP 09/02/18 1111 129/84     Pulse Rate 09/02/18 1111 73     Resp 09/02/18 1111 16     Temp 09/02/18 1111 98.4 F (36.9 C)     Temp Source 09/02/18 1111 Oral     SpO2 09/02/18 1111 97 %     Weight --      Height --      Head Circumference --      Peak Flow --      Pain Score 09/02/18 1119 6     Pain Loc --      Pain Edu? --      Excl. in GC? --    No data found.  Updated Vital Signs BP 129/84 (BP Location: Left Arm)   Pulse 73   Temp 98.4 F (36.9 C) (Oral)   Resp 16   LMP 07/28/2018 (Approximate)   SpO2 97%   Physical Exam Constitutional:      General: She is not in acute distress.    Appearance: She is well-developed. She is not ill-appearing, toxic-appearing or diaphoretic.  HENT:     Head: Normocephalic and atraumatic.     Right Ear: Tympanic membrane, ear canal and external ear normal. Tympanic membrane is not erythematous or bulging.     Left Ear: Tympanic membrane, ear canal and external ear normal. Tympanic membrane is  not erythematous or bulging.     Nose: Nose normal.     Right Sinus: No maxillary sinus tenderness or frontal sinus tenderness.     Left Sinus: No maxillary sinus tenderness or frontal sinus tenderness.     Mouth/Throat:     Pharynx: Uvula midline.  Eyes:     Conjunctiva/sclera: Conjunctivae normal.     Pupils: Pupils are equal, round, and reactive to light.  Neck:     Musculoskeletal: Normal range of motion and neck supple.  Cardiovascular:     Rate and Rhythm: Normal rate and regular rhythm.     Heart sounds: Normal heart sounds. No murmur. No friction rub. No gallop.   Pulmonary:     Effort: Pulmonary effort is normal. No respiratory distress.     Breath sounds: Normal breath sounds. No stridor. No decreased breath sounds, wheezing, rhonchi or rales.  Abdominal:     General: Bowel sounds are normal.     Palpations: Abdomen is soft. There is no mass.     Tenderness: There is no abdominal tenderness. There is no guarding or rebound.  Skin:    General: Skin is warm and dry.  Neurological:     Mental Status: She is alert and oriented to person, place, and time. Mental status is at baseline.     GCS: GCS eye subscore is 4. GCS verbal subscore is 5. GCS motor subscore is 6.     Cranial Nerves: Cranial nerves are intact.     Sensory: Sensation is intact.     Motor: Motor function is intact.     Coordination: Coordination is intact.     Gait: Gait is intact.  Psychiatric:        Behavior: Behavior normal.        Judgment: Judgment normal.      UC Treatments / Results  Labs (all labs ordered are listed, but only abnormal results are displayed) Labs Reviewed  POCT URINE PREGNANCY - Normal    EKG None  Radiology No results found.  Procedures Procedures (including critical care time)  Medications Ordered in UC Medications  metoCLOPramide (REGLAN) injection  5 mg (has no administration in time range)  dexamethasone (DECADRON) injection 10 mg (has no administration in  time range)  ketorolac (TORADOL) 15 MG/ML injection 15 mg (has no administration in time range)    Initial Impression / Assessment and Plan / UC Course  I have reviewed the triage vital signs and the nursing notes.  Pertinent labs & imaging results that were available during my care of the patient were reviewed by me and considered in my medical decision making (see chart for details).    Exam reassuring.  Urine pregnancy negative.  Toradol, Reglan, Decadron injection in office today.  Other symptomatic treatment discussed.  Push fluids.  Return precautions given.  Patient expresses understanding and agrees to plan.  Final Clinical Impressions(s) / UC Diagnoses   Final diagnoses:  Acute intractable headache, unspecified headache type  Intractable vomiting with nausea, unspecified vomiting type  Viral illness    ED Prescriptions    Medication Sig Dispense Auth. Provider   ondansetron (ZOFRAN ODT) 4 MG disintegrating tablet Take 1 tablet (4 mg total) by mouth every 8 (eight) hours as needed for nausea or vomiting. 10 tablet Threasa Alpha, PA-C 09/02/18 1220

## 2019-02-18 ENCOUNTER — Other Ambulatory Visit: Payer: Self-pay

## 2019-02-18 ENCOUNTER — Ambulatory Visit
Admission: EM | Admit: 2019-02-18 | Discharge: 2019-02-18 | Disposition: A | Discharge status: Home / Self Care | Payer: BLUE CROSS/BLUE SHIELD | Attending: Emergency Medicine | Admitting: Emergency Medicine

## 2019-02-18 DIAGNOSIS — H60331 Swimmer's ear, right ear: Secondary | ICD-10-CM | POA: Diagnosis not present

## 2019-02-18 DIAGNOSIS — H9201 Otalgia, right ear: Secondary | ICD-10-CM

## 2019-02-18 MED ORDER — KETOROLAC TROMETHAMINE 60 MG/2ML IM SOLN
60.0000 mg | Freq: Once | INTRAMUSCULAR | Status: AC
  Administered 2019-02-18: 60 mg via INTRAMUSCULAR

## 2019-02-18 MED ORDER — METHYLPREDNISOLONE SODIUM SUCC 125 MG IJ SOLR
125.0000 mg | Freq: Once | INTRAMUSCULAR | Status: AC
  Administered 2019-02-18: 125 mg via INTRAMUSCULAR

## 2019-02-18 MED ORDER — CIPROFLOXACIN-DEXAMETHASONE 0.3-0.1 % OT SUSP: 4.0000 [drp] | Freq: Two times a day (BID) | OTIC | 0 refills | Status: DC

## 2019-02-18 NOTE — ED Provider Notes (Addendum)
[name]    CSN: [id number] Arrival date & time: 02/18/19  1540     History   Chief Complaint Chief Complaint  Patient presents with  . Otalgia    HPI Annette Ashley is a 38 y.o. female with history of external ear infections, eustachian tube in childhood, PCOS presenting for right-sided ear aching since yesterday with swelling since this morning.  [place] last intermittent Tuesday.  Patient has been using ibuprofen and hot compresses for pain relief with mild relief of symptoms.  Denies recent travel, trauma to her ear, eye pain, nose pain, jaw pain, discharge.  Patient does admit going to the ER/hospitalization for this in 2003; does not feel this is as bad.   Past Medical History:  Diagnosis Date  . Depression   . Infertility, female     Patient Active Problem List   Diagnosis Date Noted  . Unspecified vitamin D deficiency 08/12/2012  . Morbid obesity with BMI of 40.0-44.9, adult ([place]) 08/11/2012  . Depressive disorder, not elsewhere classified 04/28/2012    Past Surgical History:  Procedure Laterality Date  . NECK SURGERY     cyst removed from L anterior neck  . TONSILLECTOMY  age 64    OB History    Gravida  0   Para  0   Term  0   Preterm  0   AB  0   Living  0     SAB  0   TAB  0   Ectopic  0   Multiple  0   Live Births               Home Medications    Prior to Admission medications   Medication Sig Start Date End Date Taking? Authorizing Provider  cetirizine (ZYRTEC) 10 MG tablet Take 10 mg by mouth daily.    [provider]  ciprofloxacin-dexamethasone (CIPRODEX) OTIC suspension Place 4 drops into the right ear 2 (two) times daily. 02/18/19   Hall-Potvin, [place], PA-C  FLUoxetine (PROZAC) 40 MG capsule Take 40 mg by mouth daily.    [provider]  ondansetron (ZOFRAN ODT) 4 MG disintegrating tablet Take 1 tablet (4 mg total) by mouth every 8  (eight) hours as needed for nausea or vomiting. 09/02/18   Ok Edwards, PA-C    Family History Family History  Problem Relation Age of Onset  . Cancer Father        lung cancer  . Heart disease Father   . Diabetes Father   . Kidney disease Father        on dialysis  . Bipolar disorder Brother   . Breast cancer Cousin   . Diabetes Maternal Aunt   . Diabetes Maternal Uncle   . Diabetes Paternal Aunt   . Diabetes Paternal Uncle   . Diabetes Maternal Grandmother   . Diabetes Paternal Grandmother     Social History Social History   Tobacco Use  . Smoking status: Former Smoker    Types: Cigarettes    Quit date: 09/28/2009    Years since quitting: 9.3  . Smokeless tobacco: Never Used  Substance Use Topics  . Alcohol use: No  . Drug use: No     Allergies   Clindamycin/lincomycin   Review of Systems Review of Systems  Constitutional: Negative for fatigue and fever.  HENT: Positive for ear pain and facial swelling. Negative for congestion, dental problem, drooling,  ear discharge, hearing loss, rhinorrhea, sinus pressure, sinus pain, sore throat, tinnitus, trouble swallowing and voice change.   Eyes: Negative for pain, redness and visual disturbance.  Respiratory: Negative for cough and shortness of breath.   Cardiovascular: Negative for chest pain and palpitations.  Gastrointestinal: Negative for abdominal pain, diarrhea and vomiting.  Musculoskeletal: Negative for arthralgias and myalgias.  Skin: Negative for rash and wound.  Neurological: Negative for syncope and headaches.     Physical Exam Triage Vital Signs ED Triage Vitals  Enc Vitals Group     BP      Pulse      Resp      Temp      Temp src      SpO2      Weight      Height      Head Circumference      Peak Flow      Pain Score      Pain Loc      Pain Edu?      Excl. in GC?    No data found.  Updated Vital Signs BP (!) 138/99 (BP Location: Left Arm)   Pulse 79   Temp 97.6 F (36.4 C) (Oral)    Resp 18   LMP 02/18/2019   Visual Acuity Right Eye Distance:   Left Eye Distance:   Bilateral Distance:    Right Eye Near:   Left Eye Near:    Bilateral Near:     Physical Exam Constitutional:      General: She is not in acute distress. HENT:     Head: Normocephalic and atraumatic.     Jaw: There is normal jaw occlusion. No tenderness or pain on movement.     Comments: Right-sided facial swelling without erythema or warmth    Right Ear: Hearing and tympanic membrane normal. No tenderness. No mastoid tenderness.     Left Ear: Hearing, tympanic membrane, ear canal and external ear normal. No tenderness. No mastoid tenderness.     Ears:     Comments: Right ear with tragal tenderness, EAC swelling and erythema with waxy discharge.    Nose: Nose normal. No nasal deformity, septal deviation or nasal tenderness.     Right Turbinates: Not swollen or pale.     Left Turbinates: Not swollen or pale.     Right Sinus: No maxillary sinus tenderness or frontal sinus tenderness.     Left Sinus: No maxillary sinus tenderness or frontal sinus tenderness.     Mouth/Throat:     Lips: Pink. No lesions.     Mouth: Mucous membranes are moist. No injury.     Pharynx: Oropharynx is clear. Uvula midline. No posterior oropharyngeal erythema or uvula swelling.     Tonsils: No tonsillar exudate. 2+ on the right. 2+ on the left.  Eyes:     General: No scleral icterus.    Conjunctiva/sclera: Conjunctivae normal.     Pupils: Pupils are equal, round, and reactive to light.  Neck:     Musculoskeletal: Normal range of motion and neck supple. No muscular tenderness.     Comments: Mild, shotty anterior cervical lymphadenopathy of right side that is mildly tender Cardiovascular:     Rate and Rhythm: Normal rate.  Pulmonary:     Effort: Pulmonary effort is normal.  Lymphadenopathy:     Cervical: Cervical adenopathy present.  Neurological:     Mental Status: She is alert and oriented to person, place, and  time.  UC Treatments / Results  Labs (all labs ordered are listed, but only abnormal results are displayed) Labs Reviewed - No data to display  EKG   Radiology No results found.  Procedures Procedures (including critical care time)  Medications Ordered in UC Medications  ketorolac (TORADOL) injection 60 mg (has no administration in time range)  methylPREDNISolone sodium succinate (SOLU-MEDROL) 125 mg/2 mL injection 125 mg (has no administration in time range)    Initial Impression / Assessment and Plan / UC Course  I have reviewed the triage vital signs and the nursing notes.  Pertinent labs & imaging results that were available during my care of the patient were reviewed by me and considered in my medical decision making (see chart for details).     1.  Right-sided swimmer's ear Patient given IM Toradol and Solu-Medrol which she tolerated well for pain and facial swelling.  We will start Ciprodex drops today, use adjuvant therapies for pain management as listed below.  Follow-up in 2 days for surveillance, will consider p.o. antibiotics if no significant provement or worsening of symptoms.  Return precautions discussed, patient verbalized understanding and is agreeable to plan. Final Clinical Impressions(s) / UC Diagnoses   Final diagnoses:  Acute swimmer's ear of right side     Discharge Instructions     Use hot compresses, Tylenol, ibuprofen for additional pain relief. Use eardrops as prescribed. Follow-up in clinic in 2 days for reevaluation, or sooner if you develop worsening ear pain, fever, pain with chewing or swallowing.    ED Prescriptions    Medication Sig Dispense Auth. Provider   ciprofloxacin-dexamethasone (CIPRODEX) OTIC suspension Place 4 drops into the right ear 2 (two) times daily. 7.5 mL Hall-Potvin, [place], PA-C     Controlled Substance Prescriptions [place] Controlled Substance Registry consulted? Not Applicable   Shea EvansHall-Potvin, ,  PA-C 02/18/19 1043    Hall-Potvin, [place], [place] 02/18/19 1044

## 2019-02-18 NOTE — Discharge Instructions (Addendum)
Use hot compresses, Tylenol, ibuprofen for additional pain relief. Use eardrops as prescribed. Follow-up in clinic in 2 days for reevaluation, or sooner if you develop worsening ear pain, fever, pain with chewing or swallowing.

## 2019-02-18 NOTE — ED Triage Notes (Signed)
Pt c/o rt ear ache since yesterday and swelling this am

## 2019-02-20 ENCOUNTER — Other Ambulatory Visit: Payer: Self-pay

## 2019-02-20 ENCOUNTER — Ambulatory Visit
Admission: EM | Admit: 2019-02-20 | Discharge: 2019-02-20 | Disposition: A | Discharge status: Home / Self Care | Payer: BLUE CROSS/BLUE SHIELD | Attending: Physician Assistant | Admitting: Physician Assistant

## 2019-02-20 DIAGNOSIS — H60391 Other infective otitis externa, right ear: Secondary | ICD-10-CM

## 2019-02-20 MED ORDER — CIPROFLOXACIN HCL 500 MG PO TABS: 500.0000 mg | ORAL_TABLET | Freq: Two times a day (BID) | ORAL | 0 refills | Status: DC

## 2019-02-20 NOTE — ED Provider Notes (Signed)
[name]    CSN: [id number] Arrival date & time: 02/20/19  1514     History   Chief Complaint Chief Complaint  Patient presents with  . Otalgia    HPI Annette Ashley is a 38 y.o. female.   38 year old female returns for follow up after visit 2 days ago. At the time, she was diagnosed with otitis externa with right facial swelling. She was given IM toradol and solumedrol in office and started on ciprodex. Patient has been taking medications as directed without relief. States she feels that the swelling and pain to the right ear is worse. Denies URI symptoms such as cough, congestion, sore throat. Denies fever, chills, night sweats. Denies spreading erythema, warmth.      Past Medical History:  Diagnosis Date  . Depression   . Infertility, female     Patient Active Problem List   Diagnosis Date Noted  . Unspecified vitamin D deficiency 08/12/2012  . Morbid obesity with BMI of 40.0-44.9, adult ([place]) 08/11/2012  . Depressive disorder, not elsewhere classified 04/28/2012    Past Surgical History:  Procedure Laterality Date  . NECK SURGERY     cyst removed from L anterior neck  . TONSILLECTOMY  age 38    OB History    Gravida  0   Para  0   Term  0   Preterm  0   AB  0   Living  0     SAB  0   TAB  0   Ectopic  0   Multiple  0   Live Births               Home Medications    Prior to Admission medications   Medication Sig Start Date End Date Taking? Authorizing Provider  cetirizine (ZYRTEC) 10 MG tablet Take 10 mg by mouth daily.    [provider]  ciprofloxacin (CIPRO) 500 MG tablet Take 1 tablet (500 mg total) by mouth every 12 (twelve) hours. 02/20/19   Tasia Catchings,  V, PA-C  ciprofloxacin-dexamethasone (CIPRODEX) OTIC suspension Place 4 drops into the right ear 2 (two) times daily. 02/18/19   Hall-Potvin, [place], PA-C  FLUoxetine (PROZAC) 40 MG capsule Take 40 mg by mouth daily.    [provider]  ondansetron  (ZOFRAN ODT) 4 MG disintegrating tablet Take 1 tablet (4 mg total) by mouth every 8 (eight) hours as needed for nausea or vomiting. 09/02/18   Ok Edwards, PA-C    Family History Family History  Problem Relation Age of Onset  . Cancer Father        lung cancer  . Heart disease Father   . Diabetes Father   . Kidney disease Father        on dialysis  . Bipolar disorder Brother   . Breast cancer Cousin   . Diabetes Maternal Aunt   . Diabetes Maternal Uncle   . Diabetes Paternal Aunt   . Diabetes Paternal Uncle   . Diabetes Maternal Grandmother   . Diabetes Paternal Grandmother     Social History Social History   Tobacco Use  . Smoking status: Former Smoker    Types: Cigarettes    Quit date: 09/28/2009    Years since quitting: 9.4  . Smokeless tobacco: Never Used  Substance Use Topics  . Alcohol use: No  . Drug use: No     Allergies   Clindamycin/lincomycin   Review of Systems Review of Systems  Reason unable  to perform ROS: See HPI as above.     Physical Exam Triage Vital Signs ED Triage Vitals [02/20/19 1523]  Enc Vitals Group     BP 140/83     Pulse Rate 83     Resp 18     Temp 98 F (36.7 C)     Temp Source Oral     SpO2 98 %     Weight      Height      Head Circumference      Peak Flow      Pain Score 5     Pain Loc      Pain Edu?      Excl. in GC?    No data found.  Updated Vital Signs BP 140/83 (BP Location: Left Arm)   Pulse 83   Temp 98 F (36.7 C) (Oral)   Resp 18   LMP 02/18/2019   SpO2 98%   Physical Exam Constitutional:      General: She is not in acute distress.    Appearance: She is well-developed. She is not ill-appearing, toxic-appearing or diaphoretic.  HENT:     Head: Normocephalic and atraumatic.     Ears:     Comments: No induration/erythema to the right external ear. Tenderness to palpation of the tragus and external ear. No tenderness to palpation of the mastoid process.   Ear canal swelling, TM not visible due to  swelling.  Eyes:     Conjunctiva/sclera: Conjunctivae normal.     Pupils: Pupils are equal, round, and reactive to light.  Pulmonary:     Effort: Pulmonary effort is normal. No respiratory distress.  Neurological:     Mental Status: She is alert and oriented to person, place, and time.    UC Treatments / Results  Labs (all labs ordered are listed, but only abnormal results are displayed) Labs Reviewed - No data to display  EKG   Radiology No results found.  Procedures Procedures (including critical care time)  Medications Ordered in UC Medications - No data to display  Initial Impression / Assessment and Plan / UC Course  I have reviewed the triage vital signs and the nursing notes.  Pertinent labs & imaging results that were available during my care of the patient were reviewed by me and considered in my medical decision making (see chart for details).    Given significant swelling of ear canal, ? Adequate exposure to drops. Ear wick applied, and will have patient continue medication. Written Rx of cipro PO provided, to start in 2-3 days if symptoms not improving. ENT information provided, to follow up if symptoms still not resolving. Return precautions given. Patient expresses understanding and agrees to plan.  Final Clinical Impressions(s) / UC Diagnoses   Final diagnoses:  Other infective acute otitis externa of right ear    ED Prescriptions    Medication Sig Dispense Auth. Provider   ciprofloxacin (CIPRO) 500 MG tablet Take 1 tablet (500 mg total) by mouth every 12 (twelve) hours. 14 tablet Threasa Alpha,  V, PA-C       ,  V, [place]-C 02/20/19 1609

## 2019-02-20 NOTE — Discharge Instructions (Signed)
Ear wick put in. Continue Ciprodex as directed. If symptoms not improving in 2-3 days, start cirpofloxacin pills as directed. Make appointment with ENT to follow up if symptoms not improving.

## 2019-02-20 NOTE — ED Triage Notes (Signed)
Pt c/o rt ear pain and swelling is worse then when she was here Saturday. States using ear drops twice a day

## 2019-06-05 ENCOUNTER — Other Ambulatory Visit: Payer: Self-pay

## 2019-06-05 ENCOUNTER — Encounter (HOSPITAL_COMMUNITY): Payer: Self-pay

## 2019-06-05 ENCOUNTER — Emergency Department (HOSPITAL_COMMUNITY)
Admission: EM | Admit: 2019-06-05 | Discharge: 2019-06-05 | Disposition: A | Discharge status: Home / Self Care | Payer: BC Managed Care – PPO | Attending: Emergency Medicine | Admitting: Emergency Medicine

## 2019-06-05 ENCOUNTER — Emergency Department (HOSPITAL_COMMUNITY): Payer: BC Managed Care – PPO

## 2019-06-05 DIAGNOSIS — Z87891 Personal history of nicotine dependence: Secondary | ICD-10-CM | POA: Insufficient documentation

## 2019-06-05 DIAGNOSIS — R1031 Right lower quadrant pain: Secondary | ICD-10-CM | POA: Insufficient documentation

## 2019-06-05 DIAGNOSIS — Z79899 Other long term (current) drug therapy: Secondary | ICD-10-CM | POA: Insufficient documentation

## 2019-06-05 DIAGNOSIS — R109 Unspecified abdominal pain: Secondary | ICD-10-CM

## 2019-06-05 LAB — CBC WITH DIFFERENTIAL/PLATELET
Abs Immature Granulocytes: 0.03 10*3/uL (ref 0.00–0.07)
Basophils Absolute: 0.1 10*3/uL (ref 0.0–0.1)
Basophils Relative: 1 %
Eosinophils Absolute: 0.3 10*3/uL (ref 0.0–0.5)
Eosinophils Relative: 3 %
HCT: 39.4 % (ref 36.0–46.0)
Hemoglobin: 12.7 g/dL (ref 12.0–15.0)
Immature Granulocytes: 0 %
Lymphocytes Relative: 32 %
Lymphs Abs: 3 10*3/uL (ref 0.7–4.0)
MCH: 28.3 pg (ref 26.0–34.0)
MCHC: 32.2 g/dL (ref 30.0–36.0)
MCV: 87.8 fL (ref 80.0–100.0)
Monocytes Absolute: 0.8 10*3/uL (ref 0.1–1.0)
Monocytes Relative: 8 %
Neutro Abs: 5.2 10*3/uL (ref 1.7–7.7)
Neutrophils Relative %: 56 %
Platelets: 334 10*3/uL (ref 150–400)
RBC: 4.49 MIL/uL (ref 3.87–5.11)
RDW: 12.7 % (ref 11.5–15.5)
WBC: 9.4 10*3/uL (ref 4.0–10.5)
nRBC: 0 % (ref 0.0–0.2)

## 2019-06-05 LAB — URINALYSIS, ROUTINE W REFLEX MICROSCOPIC
Bilirubin Urine: NEGATIVE
Glucose, UA: NEGATIVE mg/dL
Hgb urine dipstick: NEGATIVE
Ketones, ur: 5 mg/dL — AB
Leukocytes,Ua: NEGATIVE
Nitrite: NEGATIVE
Protein, ur: NEGATIVE mg/dL
Specific Gravity, Urine: 1.014 (ref 1.005–1.030)
pH: 5 (ref 5.0–8.0)

## 2019-06-05 LAB — BASIC METABOLIC PANEL
Anion gap: 10 (ref 5–15)
BUN: 11 mg/dL (ref 6–20)
CO2: 22 mmol/L (ref 22–32)
Calcium: 9 mg/dL (ref 8.9–10.3)
Chloride: 106 mmol/L (ref 98–111)
Creatinine, Ser: 0.55 mg/dL (ref 0.44–1.00)
GFR calc Af Amer: >60 mL/min (ref >60)
GFR calc non Af Amer: >60 mL/min (ref >60)
Glucose, Bld: 96 mg/dL (ref 70–99)
Potassium: 3.5 mmol/L (ref 3.5–5.1)
Sodium: 138 mmol/L (ref 135–145)

## 2019-06-05 LAB — I-STAT BETA HCG BLOOD, ED (MC, WL, AP ONLY): I-stat hCG, quantitative: <5 m[IU]/mL (ref <5)

## 2019-06-05 MED ORDER — KETOROLAC TROMETHAMINE 30 MG/ML IJ SOLN
30.0000 mg | Freq: Once | INTRAMUSCULAR | Status: AC
  Administered 2019-06-05: 30 mg via INTRAVENOUS
  Filled 2019-06-05: qty 1

## 2019-06-05 MED ORDER — HYDROMORPHONE HCL 1 MG/ML IJ SOLN
1.0000 mg | Freq: Once | INTRAMUSCULAR | Status: AC
  Administered 2019-06-05: 04:00:00 1 mg via INTRAVENOUS
  Filled 2019-06-05: qty 1

## 2019-06-05 MED ORDER — ONDANSETRON HCL 4 MG/2ML IJ SOLN
4.0000 mg | Freq: Once | INTRAMUSCULAR | Status: AC
  Administered 2019-06-05: 04:00:00 4 mg via INTRAVENOUS
  Filled 2019-06-05: qty 2

## 2019-06-05 NOTE — ED Triage Notes (Signed)
Pt arrived stating she has had kidney stones in the past and tonight at 940pm with right sided back pain and nausea. Reports presenting the same as her last kidney stone. Denies any urinary symptoms

## 2019-06-05 NOTE — ED Provider Notes (Signed)
[facility] DEPT Provider Note   CSN: [id number] Arrival date & time: 06/05/19  0320     History   Chief Complaint Chief Complaint  Patient presents with   Back Pain    HPI Annette Ashley is a 38 y.o. female.     The history is provided by the patient and medical records.  Back Pain    38 year old female with history of depression, obesity, presenting to the ED with right-sided flank pain.  States this began yesterday when she returned home from work.  States she took some Motrin and pain subsided for a short period of time, woke up again around 2 AM with severe pain in her right back with radiation towards her right abdomen/groin.  She reports associated nausea but denies vomiting.  States she has not had any dysuria.  Her urine always appears dark but she has not noticed any increased sediment or gross hematuria.  No fevers or chills.  She reports history of kidney stones in the past, this feels similar.  Prior stones always on her right side and have all passed spontaneously.   Past Medical History:  Diagnosis Date   Depression    Infertility, female     Patient Active Problem List   Diagnosis Date Noted   Unspecified vitamin D deficiency 08/12/2012   Morbid obesity with BMI of 40.0-44.9, adult ([place]) 08/11/2012   Depressive disorder, not elsewhere classified 04/28/2012    Past Surgical History:  Procedure Laterality Date   NECK SURGERY     cyst removed from L anterior neck   TONSILLECTOMY  age 46     OB History    Gravida  0   Para  0   Term  0   Preterm  0   AB  0   Living  0     SAB  0   TAB  0   Ectopic  0   Multiple  0   Live Births               Home Medications    Prior to Admission medications   Medication Sig Start Date End Date Taking? Authorizing Provider  cetirizine (ZYRTEC) 10 MG tablet Take 10 mg by mouth daily.    [provider]  ciprofloxacin (CIPRO) 500 MG tablet Take  1 tablet (500 mg total) by mouth every 12 (twelve) hours. 02/20/19   Tasia Catchings, Amy V, PA-C  ciprofloxacin-dexamethasone (CIPRODEX) OTIC suspension Place 4 drops into the right ear 2 (two) times daily. 02/18/19   Hall-Potvin, [place], PA-C  FLUoxetine (PROZAC) 40 MG capsule Take 40 mg by mouth daily.    [provider]  ondansetron (ZOFRAN ODT) 4 MG disintegrating tablet Take 1 tablet (4 mg total) by mouth every 8 (eight) hours as needed for nausea or vomiting. 09/02/18   Ok Edwards, PA-C    Family History Family History  Problem Relation Age of Onset   Cancer Father        lung cancer   Heart disease Father    Diabetes Father    Kidney disease Father        on dialysis   Bipolar disorder Brother    Breast cancer Cousin    Diabetes Maternal Aunt    Diabetes Maternal Uncle    Diabetes Paternal Aunt    Diabetes Paternal Uncle    Diabetes Maternal Grandmother    Diabetes Paternal Grandmother     Social History Social History   Tobacco  Use   Smoking status: Former Smoker    Types: Cigarettes    Quit date: 09/28/2009    Years since quitting: 9.6   Smokeless tobacco: Never Used  Substance Use Topics   Alcohol use: No   Drug use: No     Allergies   Clindamycin/lincomycin   Review of Systems Review of Systems  Genitourinary: Positive for flank pain.  All other systems reviewed and are negative.    Physical Exam Updated Vital Signs BP (!) 147/107 (BP Location: Right Wrist)    Pulse 79    Temp 98.2 F (36.8 C) (Oral)    Resp 17    Ht 5\' 3"  (1.6 m)    Wt 99.8 kg    SpO2 100%    BMI 38.97 kg/m   Physical Exam Vitals signs and nursing note reviewed.  Constitutional:      Appearance: She is well-developed.     Comments: Appears uncomfortable, changing position often during exam  HENT:     Head: Normocephalic and atraumatic.  Eyes:     Conjunctiva/sclera: Conjunctivae normal.     Pupils: Pupils are equal, round, and reactive to light.  Neck:      Musculoskeletal: Normal range of motion.  Cardiovascular:     Rate and Rhythm: Normal rate and regular rhythm.     Heart sounds: Normal heart sounds.  Pulmonary:     Effort: Pulmonary effort is normal.     Breath sounds: Normal breath sounds.  Abdominal:     General: Bowel sounds are normal.     Palpations: Abdomen is soft.     Tenderness: There is abdominal tenderness. There is right CVA tenderness.    Musculoskeletal: Normal range of motion.  Skin:    General: Skin is warm and dry.  Neurological:     Mental Status: She is alert and oriented to person, place, and time.      ED Treatments / Results  Labs (all labs ordered are listed, but only abnormal results are displayed) Labs Reviewed  URINALYSIS, ROUTINE W REFLEX MICROSCOPIC - Abnormal; Notable for the following components:      Result Value   Ketones, ur 5 (*)    All other components within normal limits  CBC WITH DIFFERENTIAL/PLATELET  BASIC METABOLIC PANEL  I-STAT BETA HCG BLOOD, ED ([facility], [facility], [facility] ONLY)    EKG None  Radiology Ct Renal Stone Study  Result Date: 06/05/2019 CLINICAL DATA:  Flank pain. Recurrent stone disease suspected. History of kidney stones. EXAM: CT ABDOMEN AND PELVIS WITHOUT CONTRAST TECHNIQUE: Multidetector CT imaging of the abdomen and pelvis was performed following the standard protocol without IV contrast. COMPARISON:  None. FINDINGS: Lower chest: Lung bases are clear. Hepatobiliary: No focal liver abnormality is seen. No gallstones, gallbladder wall thickening, or biliary dilatation. Pancreas: No ductal dilatation or inflammation. Spleen: Normal in size without focal abnormality. Adrenals/Urinary Tract: No adrenal nodule. Minimal left adrenal thickening. The kidneys are symmetric in size without stones or hydronephrosis. There is no perinephric stranding. Both ureters are decompressed without stones along the course. Urinary bladder is partially distended. No bladder wall thickening. No bladder  stone. Stomach/Bowel: Stomach is within normal limits. No appendicitis. Hyperdensity within the proximal and distal appendix may be appendicoliths versus enteric contrast from remote prior radiologic exam. No appendiceal inflammation or dilatation. No evidence of bowel wall thickening, distention, or inflammatory changes. Vascular/Lymphatic: Normal caliber abdominal aorta. No abdominopelvic adenopathy. Reproductive: Uterus is unremarkable. Symmetric prominence of both ovaries without dominant adnexal mass.  Other: No free air, free fluid, or intra-abdominal fluid collection. Tiny fat containing umbilical hernia. Musculoskeletal: There are no acute or suspicious osseous abnormalities. IMPRESSION: 1. No renal stones or obstructive uropathy. No acute abnormality in the abdomen/pelvis. 2. Symmetric prominence of both ovaries can be seen with polycystic ovarian syndrome. 3. High-density material in the proximal and distal appendix, appendicoliths versus enteric contrast from remote prior radiologic exam. Otherwise normal appendix without appendicitis. Electronically Signed   By: Narda Rutherford M.D.   On: 06/05/2019 05:09    Procedures Procedures (including critical care time)  Medications Ordered in ED Medications  ketorolac (TORADOL) 30 MG/ML injection 30 mg (has no administration in time range)  HYDROmorphone (DILAUDID) injection 1 mg (1 mg Intravenous Given 06/05/19 0406)  ondansetron (ZOFRAN) injection 4 mg (4 mg Intravenous Given 06/05/19 0406)     Initial Impression / Assessment and Plan / ED Course  I have reviewed the triage vital signs and the nursing notes.  Pertinent labs & imaging results that were available during my care of the patient were reviewed by me and considered in my medical decision making (see chart for details).  38 year old female presenting to the ED for right-sided flank pain.  Reports onset today when returning home from work, calmed a little with Motrin but awoke her  again around 2 AM.  Pain radiating to right side of abdomen and feels similar to prior kidney stones.  She is afebrile and nontoxic in appearance.  She does appear somewhat uncomfortable, changing position often during exam.  She has right-sided CVA tenderness.  Labs overall reassuring, normal renal function.  UA without any signs of infection.  CT renal study without any stones or obstructive uropathy.  Possible findings of appendicolith but appendix overall normal without surrounding edema or inflammatory changes suggestive of appendicitis.  After additional medications, patient does appear much more comfortable.  Reports she still feels a "nagging" pain in her back.  This may be musculoskeletal versus possible recently passed stone with some residual renal colic.  Patient given additional Toradol here, plan to discharge home with symptomatic care.  She was given urology follow-up, also recommended to follow-up with PCP.  She may return here for any new or acute changes.  Final Clinical Impressions(s) / ED Diagnoses   Final diagnoses:  Right flank pain    ED Discharge Orders    None       Garlon Hatchet, PA-C 06/05/19 0636    Nira Conn, MD 06/05/19 [phone]

## 2019-06-05 NOTE — Discharge Instructions (Signed)
Can safely take 800mg  motrin 3x daily and up to 2000mg  tylenol daily. If you want to try the cranberry juice or supplements I think this is fine.  Try to use the lower sugar juice. Recommend close follow-up with your primary care doctor. I have also attached information for the urology office-- can call for appt. Return here for any new/acute changes.

## 2019-06-17 ENCOUNTER — Ambulatory Visit
Admission: EM | Admit: 2019-06-17 | Discharge: 2019-06-17 | Disposition: A | Discharge status: Home / Self Care | Payer: BC Managed Care – PPO

## 2019-06-17 ENCOUNTER — Other Ambulatory Visit: Payer: Self-pay

## 2019-06-17 ENCOUNTER — Encounter: Payer: Self-pay | Admitting: Emergency Medicine

## 2019-06-17 DIAGNOSIS — Z20828 Contact with and (suspected) exposure to other viral communicable diseases: Secondary | ICD-10-CM | POA: Diagnosis not present

## 2019-06-17 DIAGNOSIS — J069 Acute upper respiratory infection, unspecified: Secondary | ICD-10-CM | POA: Diagnosis not present

## 2019-06-17 LAB — POC SARS CORONAVIRUS 2 AG -  ED: SARS Coronavirus 2 Ag: NEGATIVE

## 2019-06-17 MED ORDER — FLUTICASONE PROPIONATE 50 MCG/ACT NA SUSP: 2.0000 | Freq: Every day | NASAL | 0 refills | Status: DC

## 2019-06-17 NOTE — ED Triage Notes (Signed)
06/15/2019 started having symptoms.  Symptoms include: nausea, tired, aching, sore throat, 99 temp, intermittent diarrhea, no vomiting

## 2019-06-17 NOTE — ED Provider Notes (Signed)
[name]    CSN: [id number] Arrival date & time: 06/17/19  1233      History   Chief Complaint Chief Complaint  Patient presents with  . URI    HPI Jerome Otter is a 38 y.o. female.   38 year old female comes in for 2 day history of URI symptoms. Has had sore throat, body aches, fatigue, rhinorrhea, nasal congestion. Denies cough. She has had nausea without vomiting. Also with intermittent diarrhea. Tmax 99.8. Denies abdominal pain. Denies shortness of breath, loss of taste/smell. Former smoker. Denies sick/COVID contact, however, works with the public. Has been taking ibuprofen, tylenol.      Past Medical History:  Diagnosis Date  . Depression   . Depression   . Infertility, female     Patient Active Problem List   Diagnosis Date Noted  . Unspecified vitamin D deficiency 08/12/2012  . Morbid obesity with BMI of 40.0-44.9, adult ([place]) 08/11/2012  . Depressive disorder, not elsewhere classified 04/28/2012    Past Surgical History:  Procedure Laterality Date  . NECK SURGERY     cyst removed from L anterior neck  . TONSILLECTOMY  age 68  . TONSILLECTOMY      OB History    Gravida  0   Para  0   Term  0   Preterm  0   AB  0   Living  0     SAB  0   TAB  0   Ectopic  0   Multiple  0   Live Births               Home Medications    Prior to Admission medications   Medication Sig Start Date End Date Taking? Authorizing Provider  acetaminophen (TYLENOL) 325 MG tablet Take 650 mg by mouth every 6 (six) hours as needed.   Yes [provider]  FLUoxetine (PROZAC) 40 MG capsule Take 40 mg by mouth daily.   Yes [provider]  fluticasone (FLONASE) 50 MCG/ACT nasal spray Place 2 sprays into both nostrils daily. 06/17/19   Tasia Catchings, Haris Baack V, PA-C  cetirizine (ZYRTEC) 10 MG tablet Take 10 mg by mouth daily.  06/17/19  [provider]    Family History Family History  Problem Relation Age of Onset  .  Cancer Father        lung cancer  . Heart disease Father   . Diabetes Father   . Kidney disease Father        on dialysis  . Bipolar disorder Brother   . Breast cancer Cousin   . Diabetes Maternal Aunt   . Diabetes Maternal Uncle   . Diabetes Paternal Aunt   . Diabetes Paternal Uncle   . Diabetes Maternal Grandmother   . Diabetes Paternal Grandmother     Social History Social History   Tobacco Use  . Smoking status: Former Smoker    Types: Cigarettes    Quit date: 09/28/2009    Years since quitting: 9.7  . Smokeless tobacco: Never Used  Substance Use Topics  . Alcohol use: No  . Drug use: No     Allergies   Clindamycin/lincomycin   Review of Systems Review of Systems  Reason unable to perform ROS: See HPI as above.     Physical Exam Triage Vital Signs ED Triage Vitals [06/17/19 1242]  Enc Vitals Group     BP      Pulse      Resp  Temp      Temp src      SpO2      Weight      Height      Head Circumference      Peak Flow      Pain Score 4     Pain Loc      Pain Edu?      Excl. in [place]?    No data found.  Updated Vital Signs BP 125/67 (BP Location: Left Arm) Comment (BP Location): large cuff  Pulse 84   Temp 98.2 F (36.8 C) (Oral)   Resp 18   SpO2 98%   Physical Exam Constitutional:      General: She is not in acute distress.    Appearance: Normal appearance. She is not ill-appearing, toxic-appearing or diaphoretic.  HENT:     Head: Normocephalic and atraumatic.     Mouth/Throat:     Mouth: Mucous membranes are moist.     Pharynx: Oropharynx is clear. Uvula midline.  Neck:     Musculoskeletal: Normal range of motion and neck supple.  Cardiovascular:     Rate and Rhythm: Normal rate and regular rhythm.     Heart sounds: Normal heart sounds. No murmur. No friction rub. No gallop.   Pulmonary:     Effort: Pulmonary effort is normal. No accessory muscle usage, prolonged expiration, respiratory distress or retractions.     Comments:  Lungs clear to auscultation without adventitious lung sounds. Neurological:     General: No focal deficit present.     Mental Status: She is alert and oriented to person, place, and time.      UC Treatments / Results  Labs (all labs ordered are listed, but only abnormal results are displayed) Labs Reviewed  NOVEL CORONAVIRUS, NAA  POC SARS CORONAVIRUS 2 AG -  ED    EKG   Radiology No results found.  Procedures Procedures (including critical care time)  Medications Ordered in UC Medications - No data to display  Initial Impression / Assessment and Plan / UC Course  I have reviewed the triage vital signs and the nursing notes.  Pertinent labs & imaging results that were available during my care of the patient were reviewed by me and considered in my medical decision making (see chart for details).    Rapid COVID negative. PCR test ordered. Patient to quarantine until testing results return. No alarming signs on exam.  Patient speaking in full sentences without respiratory distress.  Symptomatic treatment discussed.  Push fluids.  Return precautions given.  Patient expresses understanding and agrees to plan.  Final Clinical Impressions(s) / UC Diagnoses   Final diagnoses:  Viral URI    ED Prescriptions    Medication Sig Dispense Auth. Provider   fluticasone (FLONASE) 50 MCG/ACT nasal spray Place 2 sprays into both nostrils daily. 1 g Belinda Fisher, PA-C     PDMP not reviewed this encounter.   Belinda Fisher, PA-C 06/17/19 1924

## 2019-06-17 NOTE — Discharge Instructions (Signed)
Rapid COVID negative. PCR testing ordered. I would like you to quarantine until testing results. Flonase as directed for nasal drainage/sinus pressure. If experiencing shortness of breath, trouble breathing, go to the emergency department for further evaluation needed.

## 2019-06-19 LAB — NOVEL CORONAVIRUS, NAA: SARS-CoV-2, NAA: NOT DETECTED

## 2019-06-24 ENCOUNTER — Telehealth: Payer: Self-pay | Admitting: Emergency Medicine

## 2019-06-24 NOTE — Telephone Encounter (Signed)
Patient called to get COVID results.  Confirmed identity using two identifiers and providers negative result.  Patient verbalized understanding.

## 2019-09-20 ENCOUNTER — Ambulatory Visit
Admission: EM | Admit: 2019-09-20 | Discharge: 2019-09-20 | Disposition: A | Discharge status: Home / Self Care | Payer: BC Managed Care – PPO

## 2019-09-20 DIAGNOSIS — M25532 Pain in left wrist: Secondary | ICD-10-CM

## 2019-09-20 NOTE — ED Triage Notes (Signed)
Pt c/o lt hand pain when making a fist, pain radiating up forearm. States pain started Monday after her dog jumped on her and then again today. Pain worse today.

## 2019-09-20 NOTE — ED Provider Notes (Signed)
[name]    CSN: [id number] Arrival date & time: 09/20/19  1325      History   Chief Complaint Chief Complaint  Patient presents with  . Hand Injury    HPI Annette Ashley is a 39 y.o. female presenting for left wrist pain status post injury.  Patient states 1 week ago her 40 pound dog jumped on her, and then it happened again today.  States she notices mild swelling to the affected wrist.  Pain is worse when making a fist.  Denies numbness, tingling, open wound.  Denies bite.  Has not taken anything for pain.   Past Medical History:  Diagnosis Date  . Depression   . Depression   . Infertility, female     Patient Active Problem List   Diagnosis Date Noted  . Unspecified vitamin D deficiency 08/12/2012  . Morbid obesity with BMI of 40.0-44.9, adult ([facility]) 08/11/2012  . Depressive disorder, not elsewhere classified 04/28/2012    Past Surgical History:  Procedure Laterality Date  . NECK SURGERY     cyst removed from L anterior neck  . TONSILLECTOMY  age 20  . TONSILLECTOMY      OB History    Gravida  0   Para  0   Term  0   Preterm  0   AB  0   Living  0     SAB  0   TAB  0   Ectopic  0   Multiple  0   Live Births               Home Medications    Prior to Admission medications   Medication Sig Start Date End Date Taking? Authorizing Provider  acetaminophen (TYLENOL) 325 MG tablet Take 650 mg by mouth every 6 (six) hours as needed.    [provider]  FLUoxetine (PROZAC) 40 MG capsule Take 40 mg by mouth daily.    [provider]  fluticasone (FLONASE) 50 MCG/ACT nasal spray Place 2 sprays into both nostrils daily. 06/17/19   Cathie Hoops, Amy V, PA-C  cetirizine (ZYRTEC) 10 MG tablet Take 10 mg by mouth daily.  06/17/19  [provider]    Family History Family History  Problem Relation Age of Onset  . Cancer Father        lung cancer  . Heart disease Father   . Diabetes Father   . Kidney disease  Father        on dialysis  . Bipolar disorder Brother   . Breast cancer Cousin   . Diabetes Maternal Aunt   . Diabetes Maternal Uncle   . Diabetes Paternal Aunt   . Diabetes Paternal Uncle   . Diabetes Maternal Grandmother   . Diabetes Paternal Grandmother     Social History Social History   Tobacco Use  . Smoking status: Former Smoker    Types: Cigarettes    Quit date: 09/28/2009    Years since quitting: 9.9  . Smokeless tobacco: Never Used  Substance Use Topics  . Alcohol use: No  . Drug use: No     Allergies   Clindamycin/lincomycin   Review of Systems As per HPI   Physical Exam Triage Vital Signs ED Triage Vitals [09/20/19 1328]  Enc Vitals Group     BP (!) 138/91     Pulse Rate 99     Resp 20     Temp 97.9 F (36.6 C)     Temp Source Oral  SpO2 97 %     Weight      Height      Head Circumference      Peak Flow      Pain Score      Pain Loc      Pain Edu?      Excl. in [place]?    No data found.  Updated Vital Signs BP (!) 138/91 (BP Location: Left Arm)   Pulse 99   Temp 97.9 F (36.6 C) (Oral)   Resp 20   LMP 09/14/2019   SpO2 97%   Visual Acuity Right Eye Distance:   Left Eye Distance:   Bilateral Distance:    Right Eye Near:   Left Eye Near:    Bilateral Near:     Physical Exam Constitutional:      General: She is not in acute distress. HENT:     Head: Normocephalic and atraumatic.  Eyes:     General: No scleral icterus.    Pupils: Pupils are equal, round, and reactive to light.  Cardiovascular:     Rate and Rhythm: Normal rate.  Pulmonary:     Effort: Pulmonary effort is normal.  Musculoskeletal:     Right wrist: Normal.     Left wrist: Swelling and tenderness present. No deformity, effusion, lacerations, bony tenderness, snuff box tenderness or crepitus. Normal range of motion. Normal pulse.     Comments: Trace swelling in left wrist as compared to right.  Tender over dorsal aspect of wrist sparing bony prominences.   Skin:    General: Skin is warm.     Capillary Refill: Capillary refill takes less than 2 seconds.     Coloration: Skin is not jaundiced or pale.     Findings: No bruising, lesion or rash.  Neurological:     Mental Status: She is alert and oriented to person, place, and time.     Sensory: No sensory deficit.     Motor: No weakness.     Deep Tendon Reflexes: Reflexes normal.      UC Treatments / Results  Labs (all labs ordered are listed, but only abnormal results are displayed) Labs Reviewed - No data to display  EKG   Radiology No results found.  Procedures Procedures (including critical care time)  Medications Ordered in UC Medications - No data to display  Initial Impression / Assessment and Plan / UC Course  I have reviewed the triage vital signs and the nursing notes.  Pertinent labs & imaging results that were available during my care of the patient were reviewed by me and considered in my medical decision making (see chart for details).     Low velocity impact and patient is with full range of motion, mild tenderness and swelling.  Neurovascularly intact without bony tenderness: Radiography deferred at this time-would consider in 3 to 5 days if persistent or worsening pain, swelling despite interventions.  Will treat supportively as outlined below.  Patient has a wrist brace at home: Wondering she can use this.  Advised that she can use for compression, though recommend patient perform rehab exercises daily.  Return precautions discussed, patient verbalized understanding and is agreeable to plan. Final Clinical Impressions(s) / UC Diagnoses   Final diagnoses:  Acute pain of left wrist     Discharge Instructions     Recommend RICE: rest, ice, compression, elevation as needed for pain.    Heat therapy (hot compress, warm wash red, hot showers, etc.) can help relax muscles and  soothe muscle aches. Cold therapy (ice packs) can be used to help swelling both after  injury and after prolonged use of areas of chronic pain/aches.  For pain: recommend 350 mg-1000 mg of Tylenol (acetaminophen) and/or 200 mg - 800 mg of Advil (ibuprofen, Motrin) every 8 hours as needed.  May alternate between the two throughout the day as they are generally safe to take together.  DO NOT exceed more than 3000 mg of Tylenol or 3200 mg of ibuprofen in a 24 hour period as this could damage your stomach, kidneys, liver, or increase your bleeding risk.  May take muscle relaxer as needed for severe pain / spasm.  (This medication may cause you to become tired so it is important you do not drink alcohol or operate heavy machinery while on this medication.  Recommend your first dose to be taken before bedtime to monitor for side effects safely)  Return for worsening pain, swelling, numbness, redness, fever.    ED Prescriptions    None     PDMP not reviewed this encounter.   Hall-Potvin, [place], [place] 09/20/19 1445

## 2019-09-20 NOTE — Discharge Instructions (Addendum)
Recommend RICE: rest, ice, compression, elevation as needed for pain.    Heat therapy (hot compress, warm wash red, hot showers, etc.) can help relax muscles and soothe muscle aches. Cold therapy (ice packs) can be used to help swelling both after injury and after prolonged use of areas of chronic pain/aches.  For pain: recommend 350 mg-1000 mg of Tylenol (acetaminophen) and/or 200 mg - 800 mg of Advil (ibuprofen, Motrin) every 8 hours as needed.  May alternate between the two throughout the day as they are generally safe to take together.  DO NOT exceed more than 3000 mg of Tylenol or 3200 mg of ibuprofen in a 24 hour period as this could damage your stomach, kidneys, liver, or increase your bleeding risk.  May take muscle relaxer as needed for severe pain / spasm.  (This medication may cause you to become tired so it is important you do not drink alcohol or operate heavy machinery while on this medication.  Recommend your first dose to be taken before bedtime to monitor for side effects safely)  Return for worsening pain, swelling, numbness, redness, fever.

## 2019-12-07 IMAGING — CT CT ANGIO CHEST
2 of 8 series · 18 of 46 positions shown · IV contrast (iopamidol)
Comparison: Chest CT October 08, 2015 and chest radiograph Sorin

CLINICAL DATA: Shortness of breath and chest pain

EXAM:
CT ANGIOGRAPHY CHEST WITH CONTRAST
TECHNIQUE: Multidetector CT imaging of the chest was performed using the
standard protocol during bolus administration of intravenous
contrast. Multiplanar CT image reconstructions and MIPs were
obtained to evaluate the vascular anatomy.
CONTRAST:  100mL UKNIAH-A14 IOPAMIDOL (UKNIAH-A14) INJECTION 76%

[Series 6: thins · axial · 0.71mm/px · z∈[+930,+1136]mm · 15 of 228 slices shown]
[im 11/228  lung]
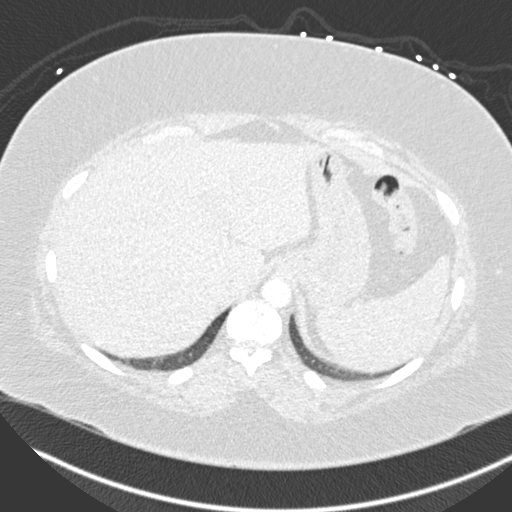
[im 31/228  soft-tissue]
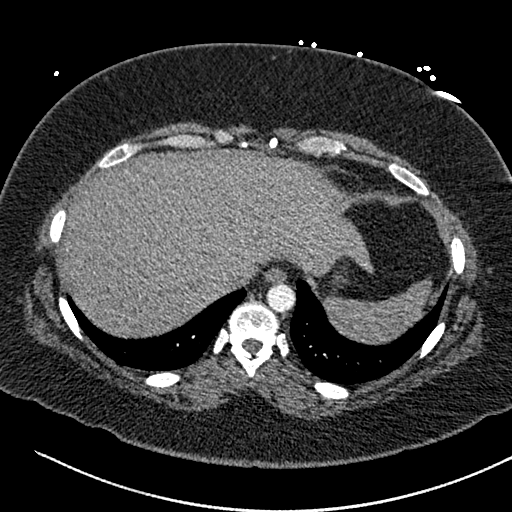
[im 42/228  lung]
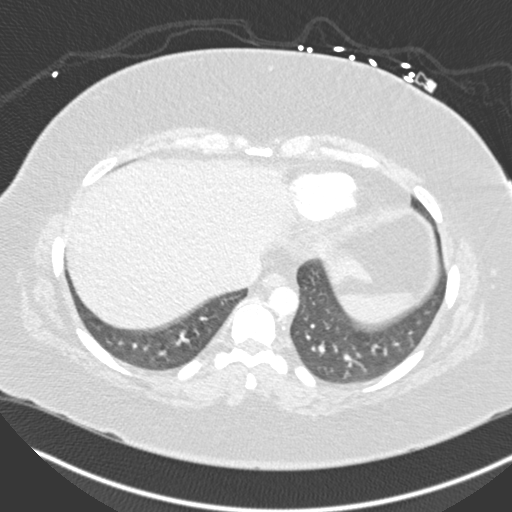
[im 52/228  soft-tissue]
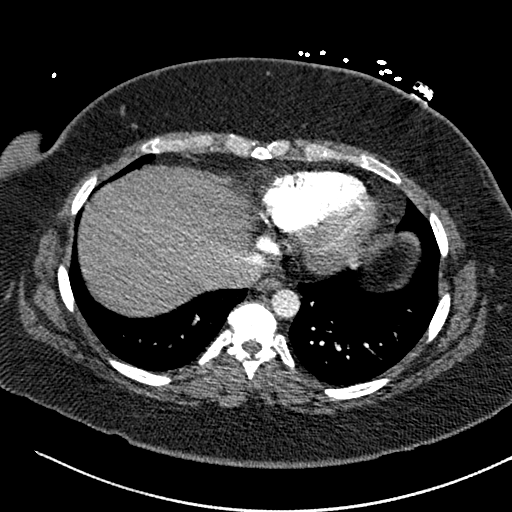
[im 73/228  lung]
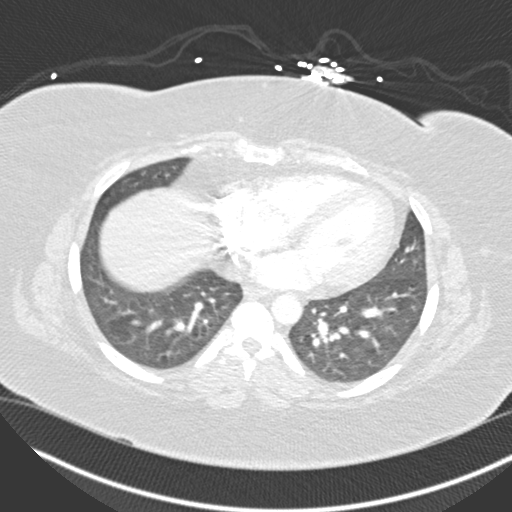
[im 83/228  soft-tissue]
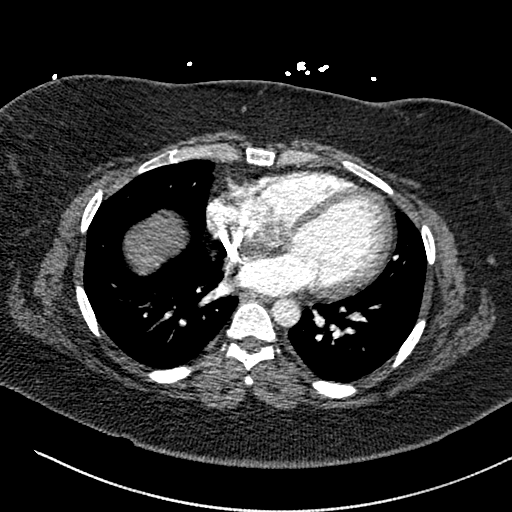
[im 104/228  lung]
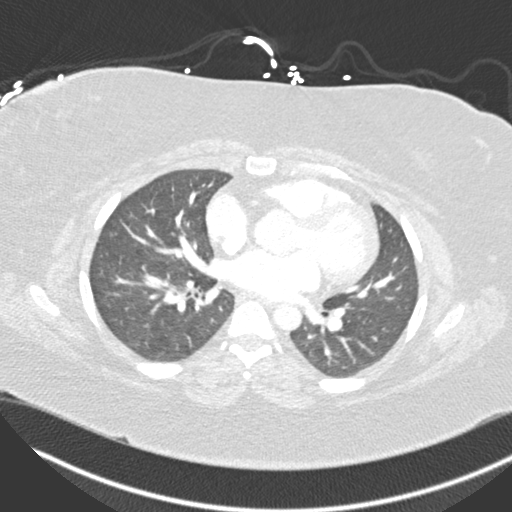
[im 114/228  soft-tissue]
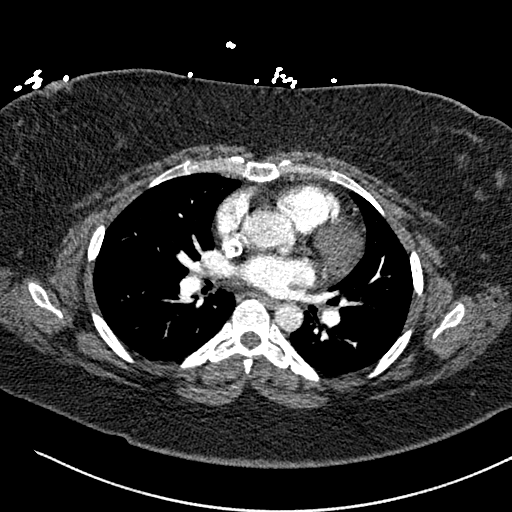
[im 124/228  lung]
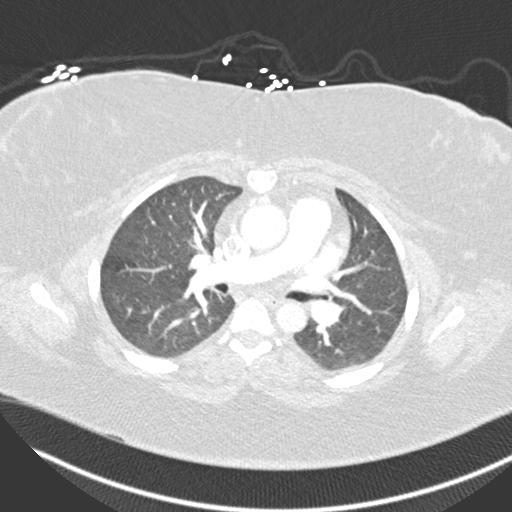
[im 145/228  soft-tissue]
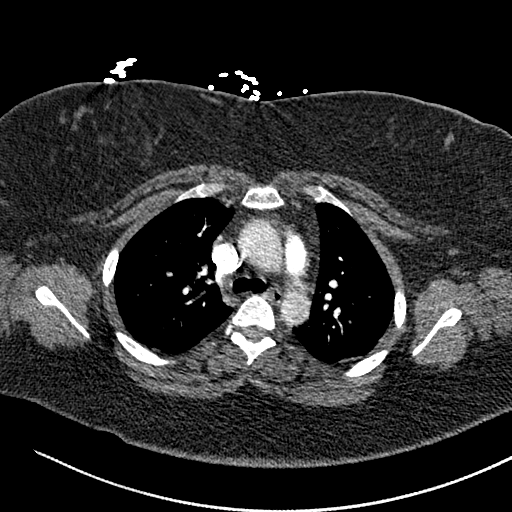
[im 155/228  lung]
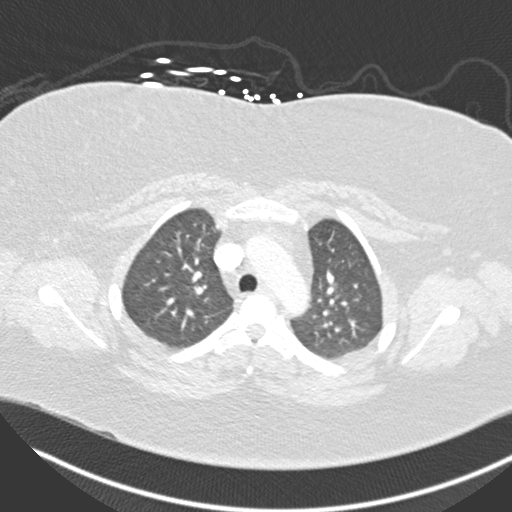
[im 176/228  soft-tissue]
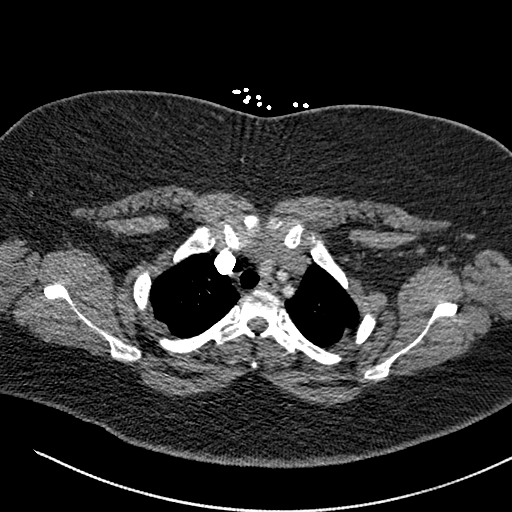
[im 186/228  lung]
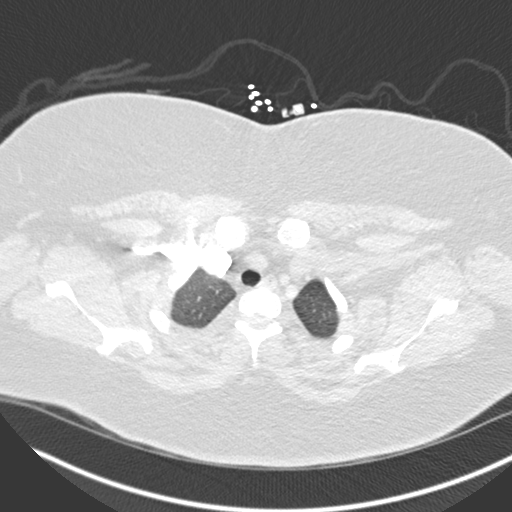
[im 197/228  soft-tissue]
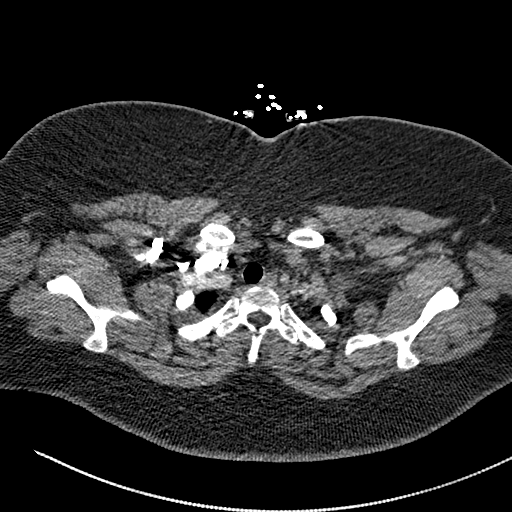
[im 217/228  lung]
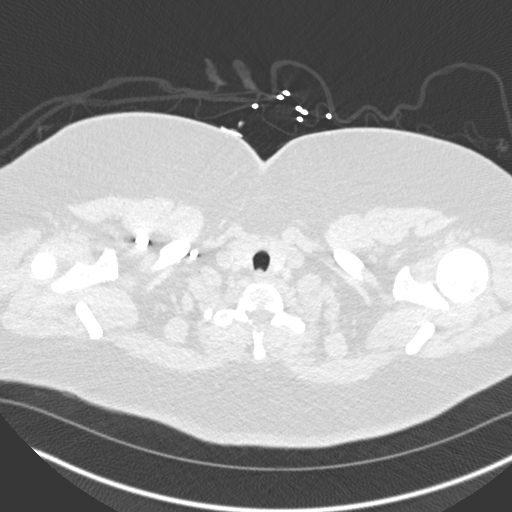

[Series 8: coronal mpr · coronal · 0.45mm/px · 3 of 123 slices shown]
[im 31/123  soft-tissue]
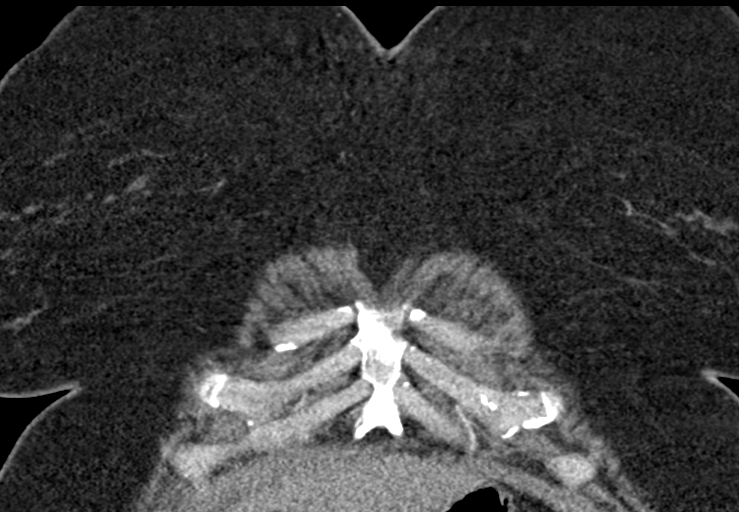
[im 62/123  soft-tissue]
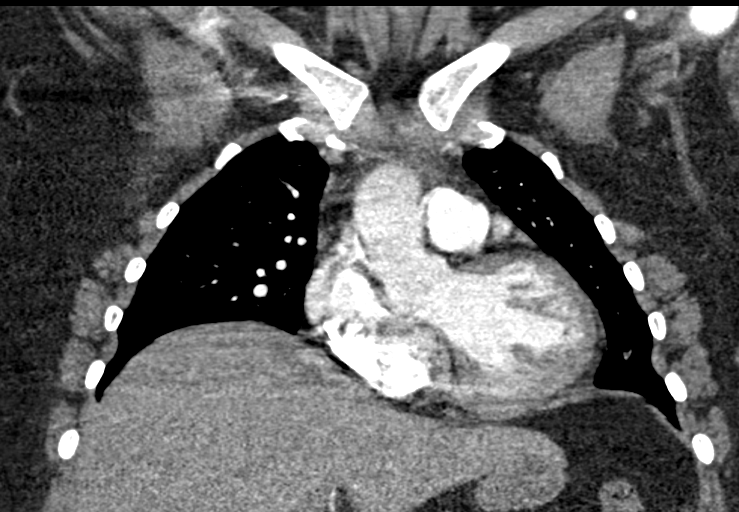
[im 92/123  soft-tissue]
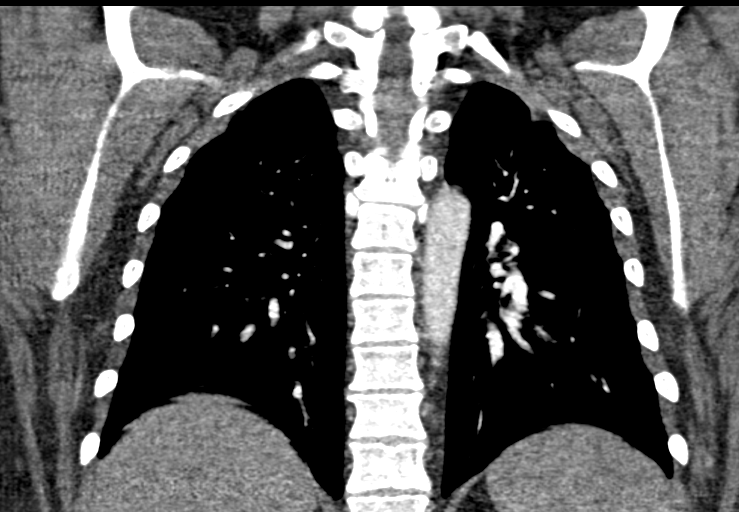

[18 of 46 positions shown; findings below may reference images not displayed]

FINDINGS: Cardiovascular: There is no demonstrable pulmonary embolus. There is
no thoracic aortic aneurysm or dissection. Visualized great vessels
appear unremarkable. Right innominate and left common carotid
arteries arise as a common trunk, an anatomic variant. There is no
pericardial effusion or pericardial thickening.

Mediastinum/Nodes: Visualized thyroid appears unremarkable. There is
no appreciable thoracic adenopathy. No esophageal lesions are
appreciable.

Lungs/Pleura: There is no evident edema or consolidation. No pleural
effusion or pleural thickening evident.

Upper Abdomen: Visualized upper abdominal structures appear
unremarkable.

Musculoskeletal: There are no blastic or lytic bone lesions. No
chest wall lesions are appreciable.

Review of the MIP images confirms the above findings.
IMPRESSION: 1. No demonstrable pulmonary embolus. No thoracic aortic aneurysm or
dissection.

2.  Lungs clear.

3.  No demonstrable thoracic adenopathy.

## 2020-07-15 ENCOUNTER — Other Ambulatory Visit: Payer: Self-pay

## 2020-07-15 ENCOUNTER — Ambulatory Visit
Admission: EM | Admit: 2020-07-15 | Discharge: 2020-07-15 | Disposition: A | Discharge status: Home / Self Care | Payer: BC Managed Care – PPO | Attending: Emergency Medicine | Admitting: Emergency Medicine

## 2020-07-15 DIAGNOSIS — J069 Acute upper respiratory infection, unspecified: Secondary | ICD-10-CM

## 2020-07-15 LAB — POCT RAPID STREP A (OFFICE): Rapid Strep A Screen: NEGATIVE

## 2020-07-15 MED ORDER — BENZONATATE 100 MG PO CAPS: 100.0000 mg | ORAL_CAPSULE | Freq: Three times a day (TID) | ORAL | 0 refills | Status: AC

## 2020-07-15 MED ORDER — ONDANSETRON 4 MG PO TBDP: 4.0000 mg | ORAL_TABLET | Freq: Three times a day (TID) | ORAL | 0 refills | Status: AC | PRN

## 2020-07-15 MED ORDER — CETIRIZINE HCL 10 MG PO TABS: 10.0000 mg | ORAL_TABLET | Freq: Every day | ORAL | 0 refills | Status: AC

## 2020-07-15 MED ORDER — FLUTICASONE PROPIONATE 50 MCG/ACT NA SUSP: 1.0000 | Freq: Every day | NASAL | 0 refills | Status: AC

## 2020-07-15 NOTE — ED Triage Notes (Signed)
Pt presents with complaints of emesis, cold chills, low grade fever, cough, runny nose, and sore throat x 2 days. Pt also reports chest congestion and pain in her chest and back with cough. Cough is productive with dark green mucus.

## 2020-07-15 NOTE — ED Provider Notes (Signed)
[name]    CSN: [id number] Arrival date & time: 07/15/20  1633      History   Chief Complaint Chief Complaint  Patient presents with  . Emesis    HPI Annette Ashley is a 39 y.o. female  History was provided by the patient. Annette Ashley is a 39 y.o. female who presents for evaluation of symptoms of a URI. Symptoms include nasal blockage, post nasal drip, sinus and nasal congestion and sore throat. Onset of symptoms was a few days ago, unchanged since that time. Associated symptoms include nausea w/ vomiting 2/2 "gagging on my snot".  She is drinking plenty of fluids. Evaluation to date: none. Treatment to date: none The following portions of the patient's history were reviewed and updated as appropriate: allergies, current medications, past family history, past medical history, past social history, past surgical history and problem list.     Past Medical History:  Diagnosis Date  . Depression   . Depression   . Infertility, female     Patient Active Problem List   Diagnosis Date Noted  . Unspecified vitamin D deficiency 08/12/2012  . Morbid obesity with BMI of 40.0-44.9, adult (HCC) 08/11/2012  . Depressive disorder, not elsewhere classified 04/28/2012    Past Surgical History:  Procedure Laterality Date  . NECK SURGERY     cyst removed from L anterior neck  . TONSILLECTOMY  age 81  . TONSILLECTOMY      OB History    Gravida  0   Para  0   Term  0   Preterm  0   AB  0   Living  0     SAB  0   IAB  0   Ectopic  0   Multiple  0   Live Births               Home Medications    Prior to Admission medications   Medication Sig Start Date End Date Taking? Authorizing Provider  benzonatate (TESSALON) 100 MG capsule Take 1 capsule (100 mg total) by mouth every 8 (eight) hours. 07/15/20  Yes Hall-Potvin, [place], PA-C  cetirizine (ZYRTEC ALLERGY) 10 MG tablet Take 1 tablet (10 mg total) by mouth daily. 07/15/20  Yes  Hall-Potvin, [place], PA-C  fluticasone (FLONASE) 50 MCG/ACT nasal spray Place 1 spray into both nostrils daily. 07/15/20  Yes Hall-Potvin, [place], PA-C  ondansetron (ZOFRAN ODT) 4 MG disintegrating tablet Take 1 tablet (4 mg total) by mouth every 8 (eight) hours as needed for nausea or vomiting. 07/15/20  Yes Hall-Potvin, [place], PA-C  acetaminophen (TYLENOL) 325 MG tablet Take 650 mg by mouth every 6 (six) hours as needed.    [provider]  FLUoxetine (PROZAC) 40 MG capsule Take 40 mg by mouth daily.    [provider]    Family History Family History  Problem Relation Age of Onset  . Cancer Father        lung cancer  . Heart disease Father   . Diabetes Father   . Kidney disease Father        on dialysis  . Bipolar disorder Brother   . Breast cancer Cousin   . Diabetes Maternal Aunt   . Diabetes Maternal Uncle   . Diabetes Paternal Aunt   . Diabetes Paternal Uncle   . Diabetes Maternal Grandmother   . Diabetes Paternal Grandmother     Social History Social History   Tobacco Use  . Smoking status: Former Smoker  Types: Cigarettes    Quit date: 09/28/2009    Years since quitting: 10.8  . Smokeless tobacco: Never Used  Substance Use Topics  . Alcohol use: No  . Drug use: No     Allergies   Clindamycin/lincomycin   Review of Systems Review of Systems  Constitutional: Negative for fatigue and fever.  HENT: Positive for congestion and sore throat. Negative for dental problem, ear pain, facial swelling, hearing loss, sinus pain, trouble swallowing and voice change.   Eyes: Negative for photophobia, pain and visual disturbance.  Respiratory: Positive for cough. Negative for shortness of breath.   Cardiovascular: Negative for chest pain and palpitations.  Gastrointestinal: Positive for nausea and vomiting. Negative for diarrhea.  Musculoskeletal: Negative for arthralgias and myalgias.  Neurological: Negative for dizziness and headaches.      Physical Exam Triage Vital Signs ED Triage Vitals  Enc Vitals Group     BP 07/15/20 1802 117/83     Pulse Rate 07/15/20 1802 84     Resp 07/15/20 1802 19     Temp 07/15/20 1802 97.9 F (36.6 C)     Temp src --      SpO2 07/15/20 1802 98 %     Weight --      Height --      Head Circumference --      Peak Flow --      Pain Score 07/15/20 1800 0     Pain Loc --      Pain Edu? --      Excl. in GC? --    No data found.  Updated Vital Signs BP 117/83   Pulse 84   Temp 97.9 F (36.6 C)   Resp 19   LMP 07/09/2020   SpO2 98%   Visual Acuity Right Eye Distance:   Left Eye Distance:   Bilateral Distance:    Right Eye Near:   Left Eye Near:    Bilateral Near:     Physical Exam Constitutional:      General: She is not in acute distress.    Appearance: She is not ill-appearing or diaphoretic.  HENT:     Head: Normocephalic and atraumatic.     Right Ear: Tympanic membrane and ear canal normal.     Left Ear: Tympanic membrane and ear canal normal.     Mouth/Throat:     Mouth: Mucous membranes are moist.     Pharynx: Oropharynx is clear. No oropharyngeal exudate or posterior oropharyngeal erythema.  Eyes:     General: No scleral icterus.    Conjunctiva/sclera: Conjunctivae normal.     Pupils: Pupils are equal, round, and reactive to light.  Neck:     Comments: Trachea midline, negative JVD Cardiovascular:     Rate and Rhythm: Normal rate and regular rhythm.     Heart sounds: No murmur heard. No gallop.   Pulmonary:     Effort: Pulmonary effort is normal. No respiratory distress.     Breath sounds: No wheezing, rhonchi or rales.  Musculoskeletal:     Cervical back: Neck supple. No tenderness.  Lymphadenopathy:     Cervical: No cervical adenopathy.  Skin:    Capillary Refill: Capillary refill takes less than 2 seconds.     Coloration: Skin is not jaundiced or pale.     Findings: No rash.  Neurological:     General: No focal deficit present.     Mental  Status: She is alert and oriented to person, place, and time.  UC Treatments / Results  Labs (all labs ordered are listed, but only abnormal results are displayed) Labs Reviewed  CULTURE, GROUP A STREP ([facility])  NOVEL CORONAVIRUS, NAA  POCT RAPID STREP A (OFFICE)    EKG   Radiology No results found.  Procedures Procedures (including critical care time)  Medications Ordered in UC Medications - No data to display  Initial Impression / Assessment and Plan / UC Course  I have reviewed the triage vital signs and the nursing notes.  Pertinent labs & imaging results that were available during my care of the patient were reviewed by me and considered in my medical decision making (see chart for details).     Patient afebrile, nontoxic, with SpO2 98%.  Patient requesting strep testing-rapid negative, culture pending.  Covid PCR pending.  Patient to quarantine until results are back.  We will treat supportively as outlined below.  Return precautions discussed, patient verbalized understanding and is agreeable to plan. Final Clinical Impressions(s) / UC Diagnoses   Final diagnoses:  URI with cough and congestion     Discharge Instructions     Tessalon for cough. Start flonase, atrovent nasal spray for nasal congestion/drainage. You can use over the counter nasal saline rinse such as neti pot for nasal congestion. Keep hydrated, your urine should be clear to pale yellow in color. Tylenol/motrin for fever and pain. Monitor for any worsening of symptoms, chest pain, shortness of breath, wheezing, swelling of the throat, go to the emergency department for further evaluation needed.     ED Prescriptions    Medication Sig Dispense Auth. Provider   cetirizine (ZYRTEC ALLERGY) 10 MG tablet Take 1 tablet (10 mg total) by mouth daily. 30 tablet Hall-Potvin, [place], PA-C   fluticasone (FLONASE) 50 MCG/ACT nasal spray Place 1 spray into both nostrils daily. 16 g Hall-Potvin, [place],  PA-C   benzonatate (TESSALON) 100 MG capsule Take 1 capsule (100 mg total) by mouth every 8 (eight) hours. 21 capsule Hall-Potvin, [place], PA-C   ondansetron (ZOFRAN ODT) 4 MG disintegrating tablet Take 1 tablet (4 mg total) by mouth every 8 (eight) hours as needed for nausea or vomiting. 21 tablet Hall-Potvin, [place], PA-C     PDMP not reviewed this encounter.   Odette Fraction Advance, [place] 07/15/20 1853

## 2020-07-15 NOTE — Discharge Instructions (Addendum)

## 2020-07-16 LAB — NOVEL CORONAVIRUS, NAA: SARS-CoV-2, NAA: DETECTED — AB

## 2020-07-16 LAB — SARS-COV-2, NAA 2 DAY TAT

## 2020-07-18 LAB — CULTURE, GROUP A STREP (THRC)

## 2021-02-06 ENCOUNTER — Other Ambulatory Visit: Payer: Self-pay

## 2021-02-06 ENCOUNTER — Ambulatory Visit
Admission: EM | Admit: 2021-02-06 | Discharge: 2021-02-06 | Disposition: A | Discharge status: Home / Self Care | Payer: BC Managed Care – PPO

## 2021-02-06 DIAGNOSIS — S30861A Insect bite (nonvenomous) of abdominal wall, initial encounter: Secondary | ICD-10-CM

## 2021-02-06 DIAGNOSIS — Z23 Encounter for immunization: Secondary | ICD-10-CM

## 2021-02-06 DIAGNOSIS — W57XXXA Bitten or stung by nonvenomous insect and other nonvenomous arthropods, initial encounter: Secondary | ICD-10-CM

## 2021-02-06 MED ORDER — TETANUS-DIPHTH-ACELL PERTUSSIS 5-2.5-18.5 LF-MCG/0.5 IM SUSY
0.5000 mL | PREFILLED_SYRINGE | Freq: Once | INTRAMUSCULAR | Status: AC
  Administered 2021-02-06: 0.5 mL via INTRAMUSCULAR

## 2021-02-06 MED ORDER — DOXYCYCLINE HYCLATE 100 MG PO CAPS: 200.0000 mg | ORAL_CAPSULE | Freq: Once | ORAL | 0 refills | Status: AC

## 2021-02-06 NOTE — ED Provider Notes (Signed)
[name]    CSN: [id number] Arrival date & time: 02/06/21  1838      History   Chief Complaint Chief Complaint  Patient presents with   Insect Bite    abdomen    HPI Annette Ashley is a 40 y.o. female.   Patient presents today with a 1 day history of wound on her abdomen.  She reports experiencing a very sharp stinging pain and believes that she was stung by an insect but is unsure what it was.  She is concerned she was bitten by a brown recluse spider despite only having 1 central punctate.  She reports that this occurred as she was going to her chicken coop which is in a dark and secluded area.  She reports area of erythema and blister were actually much larger but have gradually been improving.  She does report ongoing pain which is rated 3 on a 0-10 pain scale, localized to affected area, described as shooting, no aggravating relieving factors identified.  She has tried over-the-counter medications including keeping wound clean without improvement.  She discussed this with her aunt who is a nurse who recommended that she be tested and/or treated for Lyme disease given clinical presentation.  She did not remove the tick but is unsure exactly what insect bit her.  She does report some headaches and nausea.  Denies any fever, chest pain, shortness of breath, vomiting.  She is unsure when her last tetanus was but last 1 available in epic is 2014.   Past Medical History:  Diagnosis Date   Depression    Depression    Infertility, female     Patient Active Problem List   Diagnosis Date Noted   Unspecified vitamin D deficiency 08/12/2012   Morbid obesity with BMI of 40.0-44.9, adult (HCC) 08/11/2012   Depressive disorder, not elsewhere classified 04/28/2012    Past Surgical History:  Procedure Laterality Date   NECK SURGERY     cyst removed from L anterior neck   TONSILLECTOMY  age 38   TONSILLECTOMY      OB History     Gravida  0   Para  0   Term  0    Preterm  0   AB  0   Living  0      SAB  0   IAB  0   Ectopic  0   Multiple  0   Live Births               Home Medications    Prior to Admission medications   Medication Sig Start Date End Date Taking? Authorizing Provider  albuterol (VENTOLIN HFA) 108 (90 Base) MCG/ACT inhaler TAKE 2 PUFFS BY MOUTH EVERY 6 HOURS AS NEEDED FOR WHEEZE OR SHORTNESS OF BREATH 10/10/20  Yes [provider]  amitriptyline (ELAVIL) 10 MG tablet Take by mouth. 06/26/20 06/26/21 Yes [provider]  doxycycline (VIBRAMYCIN) 100 MG capsule Take 2 capsules (200 mg total) by mouth once for 1 dose. 02/06/21 02/06/21 Yes Wade Sigala K, PA-C  rizatriptan (MAXALT-MLT) 10 MG disintegrating tablet Take 1 tablet by mouth as needed. 06/26/20  Yes [provider]  acetaminophen (TYLENOL) 325 MG tablet Take 650 mg by mouth every 6 (six) hours as needed.    [provider]  benzonatate (TESSALON) 100 MG capsule Take 1 capsule (100 mg total) by mouth every 8 (eight) hours. 07/15/20   Hall-Potvin, [place], PA-C  cetirizine (ZYRTEC ALLERGY) 10 MG tablet Take 1  tablet (10 mg total) by mouth daily. 07/15/20   Hall-Potvin, [place], PA-C  FLUoxetine (PROZAC) 40 MG capsule Take 40 mg by mouth daily.    [provider]  fluticasone (FLONASE) 50 MCG/ACT nasal spray Place 1 spray into both nostrils daily. 07/15/20   Hall-Potvin, [place], PA-C  ondansetron (ZOFRAN ODT) 4 MG disintegrating tablet Take 1 tablet (4 mg total) by mouth every 8 (eight) hours as needed for nausea or vomiting. 07/15/20   Hall-Potvin, [place], PA-C    Family History Family History  Problem Relation Age of Onset   Cancer Father        lung cancer   Heart disease Father    Diabetes Father    Kidney disease Father        on dialysis   Bipolar disorder Brother    Breast cancer Cousin    Diabetes Maternal Aunt    Diabetes Maternal Uncle    Diabetes Paternal Aunt    Diabetes Paternal Uncle     Diabetes Maternal Grandmother    Diabetes Paternal Grandmother     Social History Social History   Tobacco Use   Smoking status: Former    Types: Cigarettes    Quit date: 09/28/2009    Years since quitting: 11.3   Smokeless tobacco: Never  Substance Use Topics   Alcohol use: No   Drug use: No     Allergies   Clindamycin/lincomycin   Review of Systems Review of Systems  Constitutional:  Negative for activity change, appetite change, fatigue and fever.  Respiratory:  Negative for cough and shortness of breath.   Cardiovascular:  Negative for chest pain.  Gastrointestinal:  Positive for nausea. Negative for abdominal pain, diarrhea and vomiting.  Skin:  Positive for color change and wound.  Neurological:  Positive for headaches. Negative for dizziness and light-headedness.    Physical Exam Triage Vital Signs ED Triage Vitals  Enc Vitals Group     BP 02/06/21 1943 122/89     Pulse Rate 02/06/21 1943 81     Resp 02/06/21 1943 18     Temp 02/06/21 1943 98 F (36.7 C)     Temp Source 02/06/21 1943 Oral     SpO2 02/06/21 1943 97 %     Weight --      Height --      Head Circumference --      Peak Flow --      Pain Score 02/06/21 1947 3     Pain Loc --      Pain Edu? --      Excl. in GC? --    No data found.  Updated Vital Signs BP 122/89 (BP Location: Right Arm)   Pulse 81   Temp 98 F (36.7 C) (Oral)   Resp 18   LMP 02/02/2021 (Exact Date)   SpO2 97%   Visual Acuity Right Eye Distance:   Left Eye Distance:   Bilateral Distance:    Right Eye Near:   Left Eye Near:    Bilateral Near:     Physical Exam Vitals reviewed.  Constitutional:      General: She is awake. She is not in acute distress.    Appearance: Normal appearance. She is normal weight. She is not ill-appearing.     Comments: Very pleasant female appears stated age in no acute distress sitting comfortably in exam room  HENT:     Head: Normocephalic and atraumatic.  Cardiovascular:      Rate and Rhythm:  Normal rate and regular rhythm.     Heart sounds: Normal heart sounds, S1 normal and S2 normal. No murmur heard. Pulmonary:     Effort: Pulmonary effort is normal.     Breath sounds: Normal breath sounds. No wheezing, rhonchi or rales.     Comments: Clear to auscultation bilaterally Abdominal:     General: Bowel sounds are normal.     Palpations: Abdomen is soft.     Tenderness: There is no abdominal tenderness. There is no right CVA tenderness, left CVA tenderness, guarding or rebound.     Comments: Benign abdominal exam  Skin:    Comments: 1 mm vesicle noted with surrounding erythema.  No streaking or evidence of lymphangitis.  Area significantly improved compared to pictures provided by patient at onset of symptoms.  Psychiatric:        Behavior: Behavior is cooperative.       UC Treatments / Results  Labs (all labs ordered are listed, but only abnormal results are displayed) Labs Reviewed - No data to display  EKG   Radiology No results found.  Procedures Procedures (including critical care time)  Medications Ordered in UC Medications  Tdap (BOOSTRIX) injection 0.5 mL (has no administration in time range)    Initial Impression / Assessment and Plan / UC Course  I have reviewed the triage vital signs and the nursing notes.  Pertinent labs & imaging results that were available during my care of the patient were reviewed by me and considered in my medical decision making (see chart for details).      No skin necrosis that would indicate brown recluse bite on exam.  Discussed that it is difficult to tell exactly what bit her.  Given erythema and associated rash will empirically treat for Lyme disease with 200 mg doxycycline prophylactically.  Discussed that it is too early to obtain tickborne illness testing and if symptoms persist or if she has additional symptoms we can rethink about this in a few weeks.  Tetanus was updated today.  Encouraged her to  use cool compresses and over-the-counter analgesics for pain relief.  Discussed the importance of keeping area clean.  Discussed alarm symptoms that warrant emergent evaluation.  If she develops any skin necrosis or worsening rash she is to be seen immediately.  Strict return precautions given to which patient expressed understanding.  Final Clinical Impressions(s) / UC Diagnoses   Final diagnoses:  Insect bite of abdominal wall, initial encounter     Discharge Instructions      We called in doxycycline 100 mg initially take 2 tablets at once to cover for Lyme disease.  If you continue to have symptoms please return as we will do testing after a few weeks.  We updated your tetanus today.  Monitor the area and keep it clean.  If you have any spread of redness or any ulcerated or black areas in the middle you need to be seen immediately.     ED Prescriptions     Medication Sig Dispense Auth. Provider   doxycycline (VIBRAMYCIN) 100 MG capsule Take 2 capsules (200 mg total) by mouth once for 1 dose. 2 capsule Janae Bonser, Noberto Retort, PA-C      PDMP not reviewed this encounter.   Jeani Hawking, PA-C 02/06/21 2006

## 2021-02-06 NOTE — ED Triage Notes (Signed)
Pt was bite by an unknown insect yesterday morning on her abdomen causing a sudden onset of swelling and intermittent "shooting" pain. Soon after Pt notes bite started to blister and become more red. Has been using OTC ointment and ice with relief. Also been taking ibuprofen with some relief. Confirms intermittent nausea. Denies itchiness. No other bites noted.  Pt has pictures of the bite at the onset that she would like to show.

## 2021-02-06 NOTE — Discharge Instructions (Addendum)
We called in doxycycline 100 mg initially take 2 tablets at once to cover for Lyme disease.  If you continue to have symptoms please return as we will do testing after a few weeks.  We updated your tetanus today.  Monitor the area and keep it clean.  If you have any spread of redness or any ulcerated or black areas in the middle you need to be seen immediately.

## 2021-08-11 ENCOUNTER — Ambulatory Visit (INDEPENDENT_AMBULATORY_CARE_PROVIDER_SITE_OTHER)
Admission: EM | Admit: 2021-08-11 | Discharge: 2021-08-11 | Disposition: A | Discharge status: Home / Self Care | Payer: BC Managed Care – PPO | Source: Home / Self Care

## 2021-08-11 ENCOUNTER — Encounter (HOSPITAL_BASED_OUTPATIENT_CLINIC_OR_DEPARTMENT_OTHER): Payer: Self-pay | Admitting: Emergency Medicine

## 2021-08-11 ENCOUNTER — Other Ambulatory Visit: Payer: Self-pay

## 2021-08-11 ENCOUNTER — Encounter: Payer: Self-pay | Admitting: Emergency Medicine

## 2021-08-11 ENCOUNTER — Emergency Department (HOSPITAL_BASED_OUTPATIENT_CLINIC_OR_DEPARTMENT_OTHER)
Admission: EM | Admit: 2021-08-11 | Discharge: 2021-08-11 | Disposition: A | Discharge status: Home / Self Care | Payer: BC Managed Care – PPO | Attending: Emergency Medicine | Admitting: Emergency Medicine

## 2021-08-11 DIAGNOSIS — J029 Acute pharyngitis, unspecified: Secondary | ICD-10-CM | POA: Insufficient documentation

## 2021-08-11 DIAGNOSIS — E860 Dehydration: Secondary | ICD-10-CM | POA: Insufficient documentation

## 2021-08-11 DIAGNOSIS — R197 Diarrhea, unspecified: Secondary | ICD-10-CM | POA: Diagnosis not present

## 2021-08-11 DIAGNOSIS — R112 Nausea with vomiting, unspecified: Secondary | ICD-10-CM | POA: Diagnosis not present

## 2021-08-11 DIAGNOSIS — Z20822 Contact with and (suspected) exposure to covid-19: Secondary | ICD-10-CM | POA: Insufficient documentation

## 2021-08-11 DIAGNOSIS — B349 Viral infection, unspecified: Secondary | ICD-10-CM

## 2021-08-11 DIAGNOSIS — R0981 Nasal congestion: Secondary | ICD-10-CM | POA: Insufficient documentation

## 2021-08-11 LAB — COMPREHENSIVE METABOLIC PANEL
ALT: 18 U/L (ref 0–44)
AST: 13 U/L — ABNORMAL LOW (ref 15–41)
Albumin: 4.2 g/dL (ref 3.5–5.0)
Alkaline Phosphatase: 66 U/L (ref 38–126)
Anion gap: 9 (ref 5–15)
BUN: 9 mg/dL (ref 6–20)
CO2: 26 mmol/L (ref 22–32)
Calcium: 9.1 mg/dL (ref 8.9–10.3)
Chloride: 102 mmol/L (ref 98–111)
Creatinine, Ser: 0.56 mg/dL (ref 0.44–1.00)
GFR, Estimated: >60 mL/min (ref >60)
Glucose, Bld: 94 mg/dL (ref 70–99)
Potassium: 4 mmol/L (ref 3.5–5.1)
Sodium: 137 mmol/L (ref 135–145)
Total Bilirubin: 0.4 mg/dL (ref 0.3–1.2)
Total Protein: 6.9 g/dL (ref 6.5–8.1)

## 2021-08-11 LAB — RESP PANEL BY RT-PCR (FLU A&B, COVID) ARPGX2
Influenza A by PCR: NEGATIVE
Influenza B by PCR: NEGATIVE
SARS Coronavirus 2 by RT PCR: NEGATIVE

## 2021-08-11 LAB — POCT RAPID STREP A (OFFICE): Rapid Strep A Screen: NEGATIVE

## 2021-08-11 LAB — CBC WITH DIFFERENTIAL/PLATELET
Abs Immature Granulocytes: 0.03 10*3/uL (ref 0.00–0.07)
Basophils Absolute: 0 10*3/uL (ref 0.0–0.1)
Basophils Relative: 0 %
Eosinophils Absolute: 0.3 10*3/uL (ref 0.0–0.5)
Eosinophils Relative: 3 %
HCT: 40.8 % (ref 36.0–46.0)
Hemoglobin: 13.1 g/dL (ref 12.0–15.0)
Immature Granulocytes: 0 %
Lymphocytes Relative: 29 %
Lymphs Abs: 2.7 10*3/uL (ref 0.7–4.0)
MCH: 28.1 pg (ref 26.0–34.0)
MCHC: 32.1 g/dL (ref 30.0–36.0)
MCV: 87.6 fL (ref 80.0–100.0)
Monocytes Absolute: 0.7 10*3/uL (ref 0.1–1.0)
Monocytes Relative: 7 %
Neutro Abs: 5.6 10*3/uL (ref 1.7–7.7)
Neutrophils Relative %: 61 %
Platelets: 364 10*3/uL (ref 150–400)
RBC: 4.66 MIL/uL (ref 3.87–5.11)
RDW: 12.7 % (ref 11.5–15.5)
WBC: 9.3 10*3/uL (ref 4.0–10.5)
nRBC: 0 % (ref 0.0–0.2)

## 2021-08-11 LAB — URINALYSIS, ROUTINE W REFLEX MICROSCOPIC
Bilirubin Urine: NEGATIVE
Glucose, UA: NEGATIVE mg/dL
Hgb urine dipstick: NEGATIVE
Ketones, ur: NEGATIVE mg/dL
Leukocytes,Ua: NEGATIVE
Nitrite: NEGATIVE
Protein, ur: NEGATIVE mg/dL
Specific Gravity, Urine: 1.008 (ref 1.005–1.030)
pH: 6 (ref 5.0–8.0)

## 2021-08-11 LAB — PREGNANCY, URINE: Preg Test, Ur: NEGATIVE

## 2021-08-11 LAB — LIPASE, BLOOD: Lipase: 17 U/L (ref 11–51)

## 2021-08-11 MED ORDER — SODIUM CHLORIDE 0.9 % IV BOLUS
1000.0000 mL | Freq: Once | INTRAVENOUS | Status: AC
  Administered 2021-08-11: 1000 mL via INTRAVENOUS

## 2021-08-11 MED ORDER — ONDANSETRON HCL 4 MG/2ML IJ SOLN
4.0000 mg | Freq: Once | INTRAMUSCULAR | Status: AC
  Administered 2021-08-11: 4 mg via INTRAVENOUS
  Filled 2021-08-11: qty 2

## 2021-08-11 MED ORDER — ONDANSETRON 4 MG PO TBDP
4.0000 mg | ORAL_TABLET | Freq: Three times a day (TID) | ORAL | 0 refills | Status: AC | PRN
Start: 2021-08-11 — End: ?

## 2021-08-11 MED ORDER — LIDOCAINE VISCOUS HCL 2 % MT SOLN: 15.0000 mL | OROMUCOSAL | 0 refills | Status: AC | PRN

## 2021-08-11 MED ORDER — LIDOCAINE VISCOUS HCL 2 % MT SOLN
15.0000 mL | Freq: Once | OROMUCOSAL | Status: AC
Start: 2021-08-11 — End: 2021-08-11
  Administered 2021-08-11: 15 mL via OROMUCOSAL
  Filled 2021-08-11: qty 15

## 2021-08-11 NOTE — ED Triage Notes (Signed)
Pt via pov from UC with n/v/d, headache, congestion, sore throat since Thursday. Pt was tested at [facility] for strep and sent for dehydration. Pt alert & oriented, nad noted.

## 2021-08-11 NOTE — ED Triage Notes (Signed)
Patient c/o vomiting, headache, sore throat, congestion x 5 days.  Patient has lost 7lbs since Thursday.  Home COVID test x 2 were both negative.

## 2021-08-11 NOTE — ED Provider Notes (Signed)
[facility]   CSN: [id number] Arrival date & time: 08/11/21  1211     History  Chief Complaint  Patient presents with   Emesis   Diarrhea   Rash   Sore Throat    Annette Ashley is a 41 y.o. female with a past medical history significant for depression who presents to the ED due to nausea, vomiting, headache, sore throat, nasal congestion, and diarrhea x5 days.  Patient evaluated urgent care prior to arrival and sent to the ED due to concern for dehydration.  Patient states she has lost 7 pounds since symptom onset due to inability to tolerate p.o.  She has taken 2 at home COVID test which have been negative.  Denies chest pain, shortness of breath, abdominal pain.  Patient endorses severe sore throat.  No changes to phonation, trismus, or difficulty swallowing.       Home Medications Prior to Admission medications   Medication Sig Start Date End Date Taking? Authorizing Provider  ondansetron (ZOFRAN-ODT) 4 MG disintegrating tablet Take 1 tablet (4 mg total) by mouth every 8 (eight) hours as needed for nausea or vomiting. 08/11/21  Yes Charnae Lill, Merla Richesaroline C, PA-C  acetaminophen (TYLENOL) 325 MG tablet Take 650 mg by mouth every 6 (six) hours as needed.    [provider]  albuterol (VENTOLIN HFA) 108 (90 Base) MCG/ACT inhaler TAKE 2 PUFFS BY MOUTH EVERY 6 HOURS AS NEEDED FOR WHEEZE OR SHORTNESS OF BREATH 10/10/20   [provider]  amitriptyline (ELAVIL) 10 MG tablet Take by mouth. 06/26/20 06/26/21  [provider]  benzonatate (TESSALON) 100 MG capsule Take 1 capsule (100 mg total) by mouth every 8 (eight) hours. 07/15/20   Hall-Potvin, [place], PA-C  cetirizine (ZYRTEC ALLERGY) 10 MG tablet Take 1 tablet (10 mg total) by mouth daily. 07/15/20   Hall-Potvin, [place], PA-C  FLUoxetine (PROZAC) 40 MG capsule Take 40 mg by mouth daily.    [provider]  fluticasone (FLONASE) 50 MCG/ACT nasal spray Place 1  spray into both nostrils daily. 07/15/20   Hall-Potvin, [place], PA-C  gabapentin (NEURONTIN) 100 MG capsule Take by mouth. 06/11/21 06/11/22  [provider]  magnesium oxide (MAG-OX) 400 MG tablet Take by mouth. 03/11/21 03/11/22  [provider]  ondansetron (ZOFRAN ODT) 4 MG disintegrating tablet Take 1 tablet (4 mg total) by mouth every 8 (eight) hours as needed for nausea or vomiting. 07/15/20   Hall-Potvin, [place], PA-C  prochlorperazine (COMPAZINE) 5 MG tablet Take by mouth. 03/11/21 03/11/22  [provider]  rizatriptan (MAXALT-MLT) 10 MG disintegrating tablet Take 1 tablet by mouth as needed. 06/26/20   [provider]      Allergies    Clindamycin/lincomycin    Review of Systems   Review of Systems  Constitutional:  Positive for chills and fever.  HENT:  Positive for congestion and sore throat. Negative for trouble swallowing and voice change.   Respiratory:  Negative for shortness of breath.   Cardiovascular:  Negative for chest pain.  Gastrointestinal:  Positive for diarrhea, nausea and vomiting. Negative for abdominal pain.  All other systems reviewed and are negative.  Physical Exam Updated Vital Signs BP 127/69 (BP Location: Right Arm)    Pulse 72    Temp 98.4 F (36.9 C)    Resp 16    LMP 08/03/2021    SpO2 98%  Physical Exam Vitals and nursing note reviewed.  Constitutional:      General: She is not in  acute distress.    Appearance: She is not ill-appearing.  HENT:     Head: Normocephalic.     Mouth/Throat:     Comments: Posterior oropharynx clear and mucous membranes moist, there is mild erythema but no edema or tonsillar exudates, uvula midline, normal phonation, no trismus, tolerating secretions without difficulty. Eyes:     Pupils: Pupils are equal, round, and reactive to light.  Cardiovascular:     Rate and Rhythm: Normal rate and regular rhythm.     Pulses: Normal pulses.     Heart sounds: Normal heart sounds. No murmur  heard.   No friction rub. No gallop.  Pulmonary:     Effort: Pulmonary effort is normal.     Breath sounds: Normal breath sounds.  Abdominal:     General: Abdomen is flat. There is no distension.     Palpations: Abdomen is soft.     Tenderness: There is no abdominal tenderness. There is no guarding or rebound.  Musculoskeletal:        General: Normal range of motion.     Cervical back: Neck supple.  Skin:    General: Skin is warm and dry.  Neurological:     General: No focal deficit present.     Mental Status: She is alert.  Psychiatric:        Mood and Affect: Mood normal.        Behavior: Behavior normal.    ED Results / Procedures / Treatments   Labs (all labs ordered are listed, but only abnormal results are displayed) Labs Reviewed  COMPREHENSIVE METABOLIC PANEL - Abnormal; Notable for the following components:      Result Value   AST 13 (*)    All other components within normal limits  URINALYSIS, ROUTINE W REFLEX MICROSCOPIC - Abnormal; Notable for the following components:   Color, Urine COLORLESS (*)    All other components within normal limits  RESP PANEL BY RT-PCR (FLU A&B, COVID) ARPGX2  CBC WITH DIFFERENTIAL/PLATELET  LIPASE, BLOOD  PREGNANCY, URINE    EKG None  Radiology No results found.  Procedures Procedures    Medications Ordered in ED Medications  sodium chloride 0.9 % bolus 1,000 mL (1,000 mLs Intravenous New Bag/Given 08/11/21 1437)  ondansetron (ZOFRAN) injection 4 mg (4 mg Intravenous Given 08/11/21 1437)  lidocaine (XYLOCAINE) 2 % viscous mouth solution 15 mL (15 mLs Mouth/Throat Given 08/11/21 1520)    ED Course/ Medical Decision Making/ A&P                           Medical Decision Making Amount and/or Complexity of Data Reviewed External Data Reviewed: notes.    Details: urgent care note Labs: ordered. Decision-making details documented in ED Course.  Risk OTC drugs. Prescription drug management. Drug therapy requiring  intensive monitoring for toxicity.   41 year old female presents to the ED due to nausea, vomiting, diarrhea, headache, nasal congestion, and sore throat x5 days.  Patient sent to the ED from urgent care due to concerns about dehydration.  Upon arrival, stable vitals.  Patient is afebrile, not tachycardic or hypoxic.  Patient no acute distress.  Reassuring physical exam.  Abdomen soft, nondistended, nontender.  Low suspicion for acute abdomen.  Throat with mild erythema without exudates.  No abscess.  Uvula midline.  Patient tolerating oral secretions without difficulty.  No meningismus to suggest meningitis.  Lungs clear to auscultation bilaterally.  Suspect symptoms related to viral etiology.  COVID/influenza  test rule out infection.  Routine labs given nausea and vomiting to rule out electrolyte abnormalities.  IV fluids and Zofran given.  Viscous lidocaine for sore throat. Strep negative at [facility].   This patient presents to the ED for concern of N/V/D this involves an extensive number of treatment options, and is a complaint that carries with it a high risk of complications and morbidity.  The differential diagnosis includes gastroenteritis, acute abdomen, COVID  CBC unremarkable.  No leukocytosis and normal hemoglobin.  COVID/influenza negative.  CMP reassuring.  Normal renal function.  No major electrolyte derangements.  UA negative for signs of infection.  Pregnancy test negative.  Lipase normal.  Doubt pancreatitis.  Upon reassessment, patient admits to improvement in symptoms after IV fluids and Zofran.  Suspect viral etiology.  Discharged with symptomatic treatment. Strict ED precautions discussed with patient. Patient states understanding and agrees to plan. Patient discharged home in no acute distress and stable vitals        Final Clinical Impression(s) / ED Diagnoses Final diagnoses:  Nausea vomiting and diarrhea    Rx / DC Orders ED Discharge Orders          Ordered     ondansetron (ZOFRAN-ODT) 4 MG disintegrating tablet  Every 8 hours PRN        08/11/21 1643              Mannie Stabile, PA-C 08/11/21 1700    Tanda Rockers A, DO 08/12/21 0901

## 2021-08-11 NOTE — Discharge Instructions (Signed)
Rapid strep test was negative.  Will defer further testing to emergency department.  Please go to the hospital as soon as you leave urgent care for further evaluation and management as you may need IV fluids due to dehydration.

## 2021-08-11 NOTE — ED Provider Notes (Signed)
[name]    CSN: [id number] Arrival date & time: 08/11/21  0845      History   Chief Complaint Chief Complaint  Patient presents with   Emesis    HPI Annette Ashley is a 41 y.o. female.   Patient presents with 5-day history of nausea, vomiting, headache, sore throat, nasal congestion, diarrhea.  Patient also reports that she has lost 7 pounds since symptoms started as she has not been able to keep any food or fluids down.  Has attempted to drink Gatorade but has not been successful.  Denies any known sick contacts or fevers.  Patient has taken 2 at home COVID-19 tests that were negative.  Denies chest pain, shortness of breath, ear pain, abdominal pain.  Denies any known sick contacts.   Emesis  Past Medical History:  Diagnosis Date   Depression    Depression    Infertility, female     Patient Active Problem List   Diagnosis Date Noted   Unspecified vitamin D deficiency 08/12/2012   Morbid obesity with BMI of 40.0-44.9, adult (HCC) 08/11/2012   Depressive disorder, not elsewhere classified 04/28/2012    Past Surgical History:  Procedure Laterality Date   NECK SURGERY     cyst removed from L anterior neck   TONSILLECTOMY  age 31   TONSILLECTOMY      OB History     Gravida  0   Para  0   Term  0   Preterm  0   AB  0   Living  0      SAB  0   IAB  0   Ectopic  0   Multiple  0   Live Births               Home Medications    Prior to Admission medications   Medication Sig Start Date End Date Taking? Authorizing Provider  acetaminophen (TYLENOL) 325 MG tablet Take 650 mg by mouth every 6 (six) hours as needed.   Yes [provider]  albuterol (VENTOLIN HFA) 108 (90 Base) MCG/ACT inhaler TAKE 2 PUFFS BY MOUTH EVERY 6 HOURS AS NEEDED FOR WHEEZE OR SHORTNESS OF BREATH 10/10/20  Yes [provider]  cetirizine (ZYRTEC ALLERGY) 10 MG tablet Take 1 tablet (10 mg total) by mouth daily. 07/15/20  Yes Hall-Potvin,  [place], PA-C  FLUoxetine (PROZAC) 40 MG capsule Take 40 mg by mouth daily.   Yes [provider]  gabapentin (NEURONTIN) 100 MG capsule Take by mouth. 06/11/21 06/11/22 Yes [provider]  magnesium oxide (MAG-OX) 400 MG tablet Take by mouth. 03/11/21 03/11/22 Yes [provider]  ondansetron (ZOFRAN ODT) 4 MG disintegrating tablet Take 1 tablet (4 mg total) by mouth every 8 (eight) hours as needed for nausea or vomiting. 07/15/20  Yes Hall-Potvin, [place], PA-C  prochlorperazine (COMPAZINE) 5 MG tablet Take by mouth. 03/11/21 03/11/22 Yes [provider]  rizatriptan (MAXALT-MLT) 10 MG disintegrating tablet Take 1 tablet by mouth as needed. 06/26/20  Yes [provider]  amitriptyline (ELAVIL) 10 MG tablet Take by mouth. 06/26/20 06/26/21  [provider]  benzonatate (TESSALON) 100 MG capsule Take 1 capsule (100 mg total) by mouth every 8 (eight) hours. 07/15/20   Hall-Potvin, [place], PA-C  fluticasone (FLONASE) 50 MCG/ACT nasal spray Place 1 spray into both nostrils daily. 07/15/20   Hall-Potvin, [place], PA-C    Family History Family History  Problem Relation Age of Onset   Cancer Father  lung cancer   Heart disease Father    Diabetes Father    Kidney disease Father        on dialysis   Bipolar disorder Brother    Breast cancer Cousin    Diabetes Maternal Aunt    Diabetes Maternal Uncle    Diabetes Paternal Aunt    Diabetes Paternal Uncle    Diabetes Maternal Grandmother    Diabetes Paternal Grandmother     Social History Social History   Tobacco Use   Smoking status: Former    Types: Cigarettes    Quit date: 09/28/2009    Years since quitting: 11.8   Smokeless tobacco: Never  Substance Use Topics   Alcohol use: No   Drug use: No     Allergies   Clindamycin/lincomycin   Review of Systems Review of Systems Per HPI  Physical Exam Triage Vital Signs ED Triage Vitals  Enc Vitals Group     BP 08/11/21  1050 129/82     Pulse Rate 08/11/21 1050 73     Resp 08/11/21 1050 18     Temp 08/11/21 1050 98 F (36.7 C)     Temp Source 08/11/21 1050 Oral     SpO2 08/11/21 1050 96 %     Weight 08/11/21 1053 225 lb (102.1 kg)     Height 08/11/21 1053 5\' 2"  (1.575 m)     Head Circumference --      Peak Flow --      Pain Score 08/11/21 1052 10     Pain Loc --      Pain Edu? --      Excl. in GC? --    No data found.  Updated Vital Signs BP 129/82 (BP Location: Right Arm)    Pulse 73    Temp 98 F (36.7 C) (Oral)    Resp 18    Ht 5\' 2"  (1.575 m)    Wt 225 lb (102.1 kg)    LMP 08/03/2021    SpO2 96%    BMI 41.15 kg/m   Visual Acuity Right Eye Distance:   Left Eye Distance:   Bilateral Distance:    Right Eye Near:   Left Eye Near:    Bilateral Near:     Physical Exam Constitutional:      General: She is not in acute distress.    Appearance: Normal appearance. She is not toxic-appearing or diaphoretic.  HENT:     Head: Normocephalic and atraumatic.     Right Ear: Tympanic membrane and ear canal normal.     Left Ear: Tympanic membrane and ear canal normal.     Nose: Congestion present.     Mouth/Throat:     Mouth: Mucous membranes are moist.     Pharynx: Posterior oropharyngeal erythema present.  Eyes:     Extraocular Movements: Extraocular movements intact.     Conjunctiva/sclera: Conjunctivae normal.     Pupils: Pupils are equal, round, and reactive to light.  Cardiovascular:     Rate and Rhythm: Normal rate and regular rhythm.     Pulses: Normal pulses.     Heart sounds: Normal heart sounds.  Pulmonary:     Effort: Pulmonary effort is normal. No respiratory distress.     Breath sounds: Normal breath sounds. No stridor. No wheezing, rhonchi or rales.  Abdominal:     General: Bowel sounds are normal. There is no distension.     Palpations: Abdomen is soft.     Tenderness: There is  no abdominal tenderness.  Musculoskeletal:        General: Normal range of motion.     Cervical  back: Normal range of motion.  Skin:    General: Skin is warm and dry.  Neurological:     General: No focal deficit present.     Mental Status: She is alert and oriented to person, place, and time. Mental status is at baseline.  Psychiatric:        Mood and Affect: Mood normal.        Behavior: Behavior normal.     UC Treatments / Results  Labs (all labs ordered are listed, but only abnormal results are displayed) Labs Reviewed  CULTURE, GROUP A STREP [facility])  POCT RAPID STREP A (OFFICE)    EKG   Radiology No results found.  Procedures Procedures (including critical care time)  Medications Ordered in UC Medications - No data to display  Initial Impression / Assessment and Plan / UC Course  I have reviewed the triage vital signs and the nursing notes.  Pertinent labs & imaging results that were available during my care of the patient were reviewed by me and considered in my medical decision making (see chart for details).     Patient's symptoms appear viral in etiology.  Although, patient appears dehydrated due to weight loss and not being able to keep foods and fluids down.  Advised patient that she will most likely need IV hydration which cannot be provided at the urgent care.  Patient was advised to go to the hospital for further evaluation and treatment.  Patient was agreeable with plan.  Vital signs stable at discharge.  Agree with patient self transport to the hospital. Rapid strep test ordered and result completed prior to provider seeing patient per nursing protocol. It was negative. Throat culture pending.  Final Clinical Impressions(s) / UC Diagnoses   Final diagnoses:  Viral illness  Dehydration  Sore throat     Discharge Instructions      Rapid strep test was negative.  Will defer further testing to emergency department.  Please go to the hospital as soon as you leave urgent care for further evaluation and management as you may need IV fluids due to  dehydration.    ED Prescriptions   None    PDMP not reviewed this encounter.   Gustavus Bryant, [place] 08/11/21 1131

## 2021-08-11 NOTE — Discharge Instructions (Addendum)
It was a pleasure taking care of you today. As discussed, all of your labs were reassuring. Your COVID/flu test were negative. I suspect you have a viral infection causing your symptoms. I am sending you home with nausea medication. Take as needed. Follow-up with PCP if symptoms do not improve over the next week. Return to the ER for new or worsening symptoms.

## 2021-08-14 LAB — CULTURE, GROUP A STREP (THRC)

## 2022-09-24 ENCOUNTER — Emergency Department (HOSPITAL_COMMUNITY): Payer: BC Managed Care – PPO

## 2022-09-24 ENCOUNTER — Emergency Department (HOSPITAL_COMMUNITY)
Admission: EM | Admit: 2022-09-24 | Discharge: 2022-09-24 | Disposition: A | Discharge status: Home / Self Care | Payer: BC Managed Care – PPO | Attending: Emergency Medicine | Admitting: Emergency Medicine

## 2022-09-24 DIAGNOSIS — U071 COVID-19: Secondary | ICD-10-CM | POA: Diagnosis not present

## 2022-09-24 DIAGNOSIS — R059 Cough, unspecified: Secondary | ICD-10-CM | POA: Diagnosis present

## 2022-09-24 LAB — COMPREHENSIVE METABOLIC PANEL
ALT: 22 U/L (ref 0–44)
AST: 20 U/L (ref 15–41)
Albumin: 3.7 g/dL (ref 3.5–5.0)
Alkaline Phosphatase: 60 U/L (ref 38–126)
Anion gap: 12 (ref 5–15)
BUN: 8 mg/dL (ref 6–20)
CO2: 23 mmol/L (ref 22–32)
Calcium: 9.3 mg/dL (ref 8.9–10.3)
Chloride: 104 mmol/L (ref 98–111)
Creatinine, Ser: 0.71 mg/dL (ref 0.44–1.00)
GFR, Estimated: >60 mL/min (ref >60)
Glucose, Bld: 116 mg/dL — ABNORMAL HIGH (ref 70–99)
Potassium: 3.7 mmol/L (ref 3.5–5.1)
Sodium: 139 mmol/L (ref 135–145)
Total Bilirubin: 0.4 mg/dL (ref 0.3–1.2)
Total Protein: 6.5 g/dL (ref 6.5–8.1)

## 2022-09-24 LAB — LACTIC ACID, PLASMA: Lactic Acid, Venous: 1.7 mmol/L (ref 0.5–1.9)

## 2022-09-24 LAB — URINALYSIS, ROUTINE W REFLEX MICROSCOPIC
Bilirubin Urine: NEGATIVE
Glucose, UA: NEGATIVE mg/dL
Hgb urine dipstick: NEGATIVE
Ketones, ur: NEGATIVE mg/dL
Leukocytes,Ua: NEGATIVE
Nitrite: NEGATIVE
Protein, ur: NEGATIVE mg/dL
Specific Gravity, Urine: 1.02 (ref 1.005–1.030)
pH: 6 (ref 5.0–8.0)

## 2022-09-24 LAB — CBC WITH DIFFERENTIAL/PLATELET
Abs Immature Granulocytes: 0.01 10*3/uL (ref 0.00–0.07)
Basophils Absolute: 0 10*3/uL (ref 0.0–0.1)
Basophils Relative: 1 %
Eosinophils Absolute: 0.2 10*3/uL (ref 0.0–0.5)
Eosinophils Relative: 4 %
HCT: 39.4 % (ref 36.0–46.0)
Hemoglobin: 13.4 g/dL (ref 12.0–15.0)
Immature Granulocytes: 0 %
Lymphocytes Relative: 30 %
Lymphs Abs: 1.8 10*3/uL (ref 0.7–4.0)
MCH: 29.7 pg (ref 26.0–34.0)
MCHC: 34 g/dL (ref 30.0–36.0)
MCV: 87.4 fL (ref 80.0–100.0)
Monocytes Absolute: 0.6 10*3/uL (ref 0.1–1.0)
Monocytes Relative: 10 %
Neutro Abs: 3.3 10*3/uL (ref 1.7–7.7)
Neutrophils Relative %: 55 %
Platelets: 291 10*3/uL (ref 150–400)
RBC: 4.51 MIL/uL (ref 3.87–5.11)
RDW: 12.3 % (ref 11.5–15.5)
WBC: 6 10*3/uL (ref 4.0–10.5)
nRBC: 0 % (ref 0.0–0.2)

## 2022-09-24 LAB — RESP PANEL BY RT-PCR (RSV, FLU A&B, COVID)  RVPGX2
Influenza A by PCR: NEGATIVE
Influenza B by PCR: NEGATIVE
Resp Syncytial Virus by PCR: NEGATIVE
SARS Coronavirus 2 by RT PCR: POSITIVE — AB

## 2022-09-24 MED ORDER — ONDANSETRON 4 MG PO TBDP
4.0000 mg | ORAL_TABLET | Freq: Once | ORAL | Status: AC
  Administered 2022-09-24: 4 mg via ORAL
  Filled 2022-09-24: qty 1

## 2022-09-24 MED ORDER — ALBUTEROL SULFATE HFA 108 (90 BASE) MCG/ACT IN AERS
1.0000 | INHALATION_SPRAY | Freq: Once | RESPIRATORY_TRACT | Status: AC
  Administered 2022-09-24: 2 via RESPIRATORY_TRACT
  Filled 2022-09-24: qty 6.7

## 2022-09-24 MED ORDER — ONDANSETRON HCL 4 MG PO TABS: 4.0000 mg | ORAL_TABLET | Freq: Four times a day (QID) | ORAL | 0 refills | Status: AC

## 2022-09-24 NOTE — ED Triage Notes (Signed)
Patient here for evaluation of generalized body aches and cough that started approximately one month ago. Patient seen at PCP shortly after onset of symptoms and was told she has bronchitis, patient here for evaluation as symptoms have not improved. Patient is alert, oriented, speaking in complete sentences, and is in no apparent distress at this time.

## 2022-09-24 NOTE — ED Provider Notes (Signed)
[facility] Provider Note   CSN: [id number] Arrival date & time: 09/24/22  1432     History  Chief Complaint  Patient presents with   Generalized Body Aches    Annette Ashley is a 42 y.o. female with past medical history significant for depression, PCOS, migraines presents with concern for generalized bodyaches, cough that started approximately 1 month ago, worsened over the last 2 days.  Patient reports that she was seen and diagnosed with bronchitis, completed a steroid taper at that time, reports mild improvement with worsening over the last day.  She reports temperature of 101.7 orally with thermometer yesterday, although fever resolved.  She reports that she had some mild right lower quadrant abdominal pain yesterday that is since resolved.  She reports that she has been intermittently nauseous.  She denies acute shortness of breath, chest pain at this time.  HPI     Home Medications Prior to Admission medications   Medication Sig Start Date End Date Taking? Authorizing Provider  ondansetron (ZOFRAN) 4 MG tablet Take 1 tablet (4 mg total) by mouth every 6 (six) hours. 09/24/22  Yes Mayte Diers H, PA-C  acetaminophen (TYLENOL) 325 MG tablet Take 650 mg by mouth every 6 (six) hours as needed.    [provider]  albuterol (VENTOLIN HFA) 108 (90 Base) MCG/ACT inhaler TAKE 2 PUFFS BY MOUTH EVERY 6 HOURS AS NEEDED FOR WHEEZE OR SHORTNESS OF BREATH 10/10/20   [provider]  amitriptyline (ELAVIL) 10 MG tablet Take by mouth. 06/26/20 06/26/21  [provider]  benzonatate (TESSALON) 100 MG capsule Take 1 capsule (100 mg total) by mouth every 8 (eight) hours. 07/15/20   Hall-Potvin, [place], PA-C  cetirizine (ZYRTEC ALLERGY) 10 MG tablet Take 1 tablet (10 mg total) by mouth daily. 07/15/20   Hall-Potvin, [place], PA-C  FLUoxetine (PROZAC) 40 MG capsule Take 40 mg by mouth daily.    [provider]   fluticasone (FLONASE) 50 MCG/ACT nasal spray Place 1 spray into both nostrils daily. 07/15/20   Hall-Potvin, [place], PA-C  gabapentin (NEURONTIN) 100 MG capsule Take by mouth. 06/11/21 06/11/22  [provider]  lidocaine (XYLOCAINE) 2 % solution Use as directed 15 mLs in the mouth or throat as needed for mouth pain. 08/11/21   Suzy Bouchard, PA-C  ondansetron (ZOFRAN ODT) 4 MG disintegrating tablet Take 1 tablet (4 mg total) by mouth every 8 (eight) hours as needed for nausea or vomiting. 07/15/20   Hall-Potvin, [place], PA-C  ondansetron (ZOFRAN-ODT) 4 MG disintegrating tablet Take 1 tablet (4 mg total) by mouth every 8 (eight) hours as needed for nausea or vomiting. 08/11/21   Suzy Bouchard, PA-C  prochlorperazine (COMPAZINE) 5 MG tablet Take by mouth. 03/11/21 03/11/22  [provider]  rizatriptan (MAXALT-MLT) 10 MG disintegrating tablet Take 1 tablet by mouth as needed. 06/26/20   [provider]      Allergies    Clindamycin/lincomycin    Review of Systems   Review of Systems  Constitutional:  Positive for fatigue and fever.  Musculoskeletal:  Positive for myalgias.  All other systems reviewed and are negative.   Physical Exam Updated Vital Signs BP (!) 130/96   Pulse 79   Temp 98.5 F (36.9 C)   Resp 16   SpO2 100%  Physical Exam Vitals and nursing note reviewed.  Constitutional:      General: She is not in acute distress.    Appearance: Normal appearance. She  is obese.  HENT:     Head: Normocephalic and atraumatic.  Eyes:     General:        Right eye: No discharge.        Left eye: No discharge.  Cardiovascular:     Rate and Rhythm: Normal rate and regular rhythm.     Heart sounds: No murmur heard.    No friction rub. No gallop.  Pulmonary:     Effort: Pulmonary effort is normal.     Breath sounds: Normal breath sounds.     Comments: Mild expiratory wheeze, no acute respiratory distress, tachypnea, no rhonchi,  stridor Abdominal:     General: Bowel sounds are normal.     Palpations: Abdomen is soft.  Skin:    General: Skin is warm and dry.     Capillary Refill: Capillary refill takes less than 2 seconds.  Neurological:     Mental Status: She is alert and oriented to person, place, and time.  Psychiatric:        Mood and Affect: Mood normal.        Behavior: Behavior normal.     ED Results / Procedures / Treatments   Labs (all labs ordered are listed, but only abnormal results are displayed) Labs Reviewed  RESP PANEL BY RT-PCR (RSV, FLU A&B, COVID)  RVPGX2 - Abnormal; Notable for the following components:      Result Value   SARS Coronavirus 2 by RT PCR POSITIVE (*)    All other components within normal limits  COMPREHENSIVE METABOLIC PANEL - Abnormal; Notable for the following components:   Glucose, Bld 116 (*)    All other components within normal limits  URINALYSIS, ROUTINE W REFLEX MICROSCOPIC - Abnormal; Notable for the following components:   Bacteria, UA FEW (*)    All other components within normal limits  LACTIC ACID, PLASMA  CBC WITH DIFFERENTIAL/PLATELET  I-STAT BETA HCG BLOOD, ED ([facility], [facility], [facility] ONLY)    EKG None  Radiology DG Chest 2 View  Result Date: 09/24/2022 CLINICAL DATA:  Cough. EXAM: CHEST - 2 VIEW COMPARISON:  07/14/2018. FINDINGS: Clear lungs. Normal heart size and mediastinal contours. No pleural effusion or pneumothorax. Visualized bones and upper abdomen are unremarkable. IMPRESSION: No evidence of acute cardiopulmonary disease. Electronically Signed   By: Emmit Alexanders M.D.   On: 09/24/2022 15:13    Procedures Procedures    Medications Ordered in ED Medications  albuterol (VENTOLIN HFA) 108 (90 Base) MCG/ACT inhaler 1-2 puff (2 puffs Inhalation Given 09/24/22 1615)  ondansetron (ZOFRAN-ODT) disintegrating tablet 4 mg (4 mg Oral Given 09/24/22 1618)    ED Course/ Medical Decision Making/ A&P                             Medical Decision Making Amount  and/or Complexity of Data Reviewed Labs: ordered. Radiology: ordered.  Risk Prescription drug management.   This patient is a 42 y.o. female  who presents to the ED for concern of bodyaches, cough, fever, malaise.   Differential diagnoses prior to evaluation: The emergent differential diagnosis includes, but is not limited to,  asthma exacerbation, COPD exacerbation, acute upper respiratory infection, acute bronchitis, chronic bronchitis, interstitial lung disease, ARDS, PE, pneumonia, atypical ACS, carbon monoxide poisoning, spontaneous pneumothorax, new CHF vs CHF exacerbation, versus other . This is not an exhaustive differential.  Additionally with some brief abdominal pain considered UTI, appendicitis, nephrolithiasis, pyelonephritis, ovarian torsion, PID, tubo-ovarian abscess, versus  other.  Past Medical History / Co-morbidities: Obesity, PCOS, migraines  Additional history: Chart reviewed. Pertinent results include: Patient recently treated for bronchitis with steroid taper just last month  Physical Exam: Physical exam performed. The pertinent findings include: Patient with mild expiratory wheeze otherwise with overall stable vital signs, 99% oxygen saturation on room air, normal respiratory rate, she is mildly hypertensive blood pressure 138/1 oh blood.  She is afebrile of the emergency department, reports she has not had to take any Tylenol or ibuprofen today.  Lab Tests/Imaging studies: I personally interpreted labs/imaging and the pertinent results include: CBC unremarkable, CMP overall unremarkable, mild hyperglycemia, glucose 116, lactic acid unremarkable, RVP shows positive for COVID, UA shows few bacteria but without any white cells, nitrates, leukocytes, overall with low clinical suspicion for developing bacterial infection.  I independently interpreted plain film chest x-ray which shows no evidence of acute intrathoracic abnormality I agree with the radiologist  interpretation.   Medications: I ordered medication including albuterol for mild expiratory wheeze, Zofran for nausea.  I have reviewed the patients home medicines and have made adjustments as needed.   Disposition: After consideration of the diagnostic results and the patients response to treatment, I feel that patient's symptoms consistent with COVID.  discharge on inhaler, Zofran is appropriate, if patient is having ongoing symptoms without resolution I recommend that she follow-up with a pulmonologist, PCP for further evaluation and management, discussed emergency department return precautions  emergency department workup does not suggest an emergent condition requiring admission or immediate intervention beyond what has been performed at this time. The plan is: as above, patient with symptoms consistent with covid, she does not meet criteria to need antiviral therapy at this time. The patient is safe for discharge and has been instructed to return immediately for worsening symptoms, change in symptoms or any other concerns.  Final Clinical Impression(s) / ED Diagnoses Final diagnoses:  [id number]    Rx / DC Orders ED Discharge Orders          Ordered    ondansetron (ZOFRAN) 4 MG tablet  Every 6 hours        09/24/22 1758              Anselmo Pickler, PA-C 09/24/22 1800    Isla Pence, MD 09/24/22 1900

## 2022-09-24 NOTE — Discharge Instructions (Signed)
Continue to use your home inhaler, Tylenol, ibuprofen as needed for fevers, body aches, and follow-up with your primary care doctor if you continue to have shortness of breath after 7 to 10 days to let this virus run its course, please return the emergency department if you have severe worsening shortness of breath, fever that does not respond to Motrin, Tylenol, chest pain

## 2023-07-27 ENCOUNTER — Ambulatory Visit
Admission: EM | Admit: 2023-07-27 | Discharge: 2023-07-27 | Disposition: A | Discharge status: Home / Self Care | Payer: Commercial Managed Care - PPO | Attending: Family Medicine | Admitting: Family Medicine

## 2023-07-27 DIAGNOSIS — L02215 Cutaneous abscess of perineum: Secondary | ICD-10-CM

## 2023-07-27 MED ORDER — DOXYCYCLINE HYCLATE 100 MG PO CAPS: 100.0000 mg | ORAL_CAPSULE | Freq: Two times a day (BID) | ORAL | 0 refills | Status: DC

## 2023-07-27 NOTE — Discharge Instructions (Signed)
 Complete entire course of antibiotics. Needs to soak in warm Epsom salt baths. If abscess does not completely resolve with completion of antibiotic return for evaluation.  If abscess resolves prior to completing antibiotics till complete entire course of antibiotics to prevent recurrence of abscess.

## 2023-07-27 NOTE — ED Provider Notes (Signed)
 [name]    CSN: [id number] Arrival date & time: 07/27/23  1246      History   Chief Complaint Chief Complaint  Patient presents with   Abscess    HPI Annette Ashley is a 43 y.o. female.    Abscess Patient presents with significant other for evaluation of a painful bump left sides perineum, distal to the left buttocks. Tender to touch. No hx of recurrent abscess or chronic skin infections. No fever, nausea, or vomiting.  Past Medical History:  Diagnosis Date   Depression    Depression    Infertility, female     Patient Active Problem List   Diagnosis Date Noted   Vitamin D  deficiency 08/12/2012   Morbid obesity with BMI of 40.0-44.9, adult (HCC) 08/11/2012   Depressive disorder, not elsewhere classified 04/28/2012    Past Surgical History:  Procedure Laterality Date   NECK SURGERY     cyst removed from L anterior neck   TONSILLECTOMY  age 26   TONSILLECTOMY      OB History     Gravida  0   Para  0   Term  0   Preterm  0   AB  0   Living  0      SAB  0   IAB  0   Ectopic  0   Multiple  0   Live Births               Home Medications    Prior to Admission medications   Medication Sig Start Date End Date Taking? Authorizing Provider  cetirizine  (ZYRTEC  ALLERGY) 10 MG tablet Take 1 tablet (10 mg total) by mouth daily. 07/15/20  Yes Hall-Potvin, Brittany, PA-C  doxycycline  (VIBRAMYCIN ) 100 MG capsule Take 1 capsule (100 mg total) by mouth 2 (two) times daily. 07/27/23  Yes Arloa Suzen RAMAN, NP  FLUoxetine (PROZAC) 40 MG capsule Take 40 mg by mouth daily.   Yes [provider]  acetaminophen  (TYLENOL ) 325 MG tablet Take 650 mg by mouth every 6 (six) hours as needed.    [provider]  albuterol  (VENTOLIN  HFA) 108 (90 Base) MCG/ACT inhaler TAKE 2 PUFFS BY MOUTH EVERY 6 HOURS AS NEEDED FOR WHEEZE OR SHORTNESS OF BREATH 10/10/20   [provider]  amitriptyline (ELAVIL) 10 MG tablet Take by mouth.  06/26/20 06/26/21  [provider]  benzonatate  (TESSALON ) 100 MG capsule Take 1 capsule (100 mg total) by mouth every 8 (eight) hours. 07/15/20   Hall-Potvin, Brittany, PA-C  fluticasone  (FLONASE ) 50 MCG/ACT nasal spray Place 1 spray into both nostrils daily. 07/15/20   Hall-Potvin, Brittany, PA-C  gabapentin (NEURONTIN) 100 MG capsule Take by mouth. 06/11/21 06/11/22  [provider]  lidocaine  (XYLOCAINE ) 2 % solution Use as directed 15 mLs in the mouth or throat as needed for mouth pain. 08/11/21   Aberman, Caroline C, PA-C  ondansetron  (ZOFRAN  ODT) 4 MG disintegrating tablet Take 1 tablet (4 mg total) by mouth every 8 (eight) hours as needed for nausea or vomiting. 07/15/20   Hall-Potvin, Brittany, PA-C  ondansetron  (ZOFRAN ) 4 MG tablet Take 1 tablet (4 mg total) by mouth every 6 (six) hours. 09/24/22   Prosperi, Christian H, PA-C  ondansetron  (ZOFRAN -ODT) 4 MG disintegrating tablet Take 1 tablet (4 mg total) by mouth every 8 (eight) hours as needed for nausea or vomiting. 08/11/21   Aberman, Caroline C, PA-C  prochlorperazine (COMPAZINE) 5 MG tablet Take by mouth. 03/11/21 03/11/22  [provider]  rizatriptan (MAXALT-MLT) 10 MG disintegrating tablet Take 1 tablet by mouth as needed. 06/26/20   [provider]    Family History Family History  Problem Relation Age of Onset   Cancer Father        lung cancer   Heart disease Father    Diabetes Father    Kidney disease Father        on dialysis   Bipolar disorder Brother    Breast cancer Cousin    Diabetes Maternal Aunt    Diabetes Maternal Uncle    Diabetes Paternal Aunt    Diabetes Paternal Uncle    Diabetes Maternal Grandmother    Diabetes Paternal Grandmother     Social History Social History   Tobacco Use   Smoking status: Former    Current packs/day: 0.00    Types: Cigarettes    Quit date: 09/28/2009    Years since quitting: 13.8   Smokeless tobacco: Never  Substance Use Topics   Alcohol  use: No   Drug use: No     Allergies   Clindamycin/lincomycin   Review of Systems Review of Systems   Physical Exam Triage Vital Signs ED Triage Vitals [07/27/23 1349]  Encounter Vitals Group     BP (!) 138/95     Systolic BP Percentile      Diastolic BP Percentile      Pulse Rate 77     Resp 18     Temp 97.8 F (36.6 C)     Temp Source Oral     SpO2 99 %     Weight      Height      Head Circumference      Peak Flow      Pain Score      Pain Loc      Pain Education      Exclude from Growth Chart    No data found.  Updated Vital Signs BP (!) 138/95 (BP Location: Left Arm)   Pulse 77   Temp 97.8 F (36.6 C) (Oral)   Resp 18   LMP 06/24/2023   SpO2 99%   Visual Acuity Right Eye Distance:   Left Eye Distance:   Bilateral Distance:    Right Eye Near:   Left Eye Near:    Bilateral Near:     Physical Exam Vitals reviewed.  Constitutional:      Appearance: She is not toxic-appearing.  HENT:     Head: Normocephalic and atraumatic.  Cardiovascular:     Rate and Rhythm: Normal rate and regular rhythm.  Pulmonary:     Effort: Pulmonary effort is normal.     Breath sounds: Normal breath sounds.  Genitourinary:   Neurological:     Mental Status: She is alert.      UC Treatments / Results  Labs (all labs ordered are listed, but only abnormal results are displayed) Labs Reviewed - No data to display  EKG   Radiology No results found.  Procedures Procedures (including critical care time)  Medications Ordered in UC Medications - No data to display  Initial Impression / Assessment and Plan / UC Course  I have reviewed the triage vital signs and the nursing notes.  Pertinent labs & imaging results that were available during my care of the patient were reviewed by me and considered in my medical decision making (see chart for details).    1. Cutaneous abscess of perineum (Primary) - doxycycline  (VIBRAMYCIN ) 100 MG capsule; Take  1 capsule  (100 mg total) by mouth 2 (two) times daily.  Dispense: 20 capsule; Refill: 0 I&D not indicated. Recommend sitz baths. Return if abscess doesn't resolve with treatment. Final Clinical Impressions(s) / UC Diagnoses   Final diagnoses:  Cutaneous abscess of perineum     Discharge Instructions      Complete entire course of antibiotics. Needs to soak in warm Epsom salt baths. If abscess does not completely resolve with completion of antibiotic return for evaluation.  If abscess resolves prior to completing antibiotics till complete entire course of antibiotics to prevent recurrence of abscess.     ED Prescriptions     Medication Sig Dispense Auth. Provider   doxycycline  (VIBRAMYCIN ) 100 MG capsule Take 1 capsule (100 mg total) by mouth 2 (two) times daily. 20 capsule Arloa Suzen RAMAN, NP      PDMP not reviewed this encounter.   Arloa Suzen RAMAN, NP 07/27/23 2229

## 2023-07-27 NOTE — ED Triage Notes (Signed)
 Abscess on but cheek that's been there since 07-16-23

## 2023-07-28 ENCOUNTER — Ambulatory Visit: Payer: Self-pay

## 2023-12-14 ENCOUNTER — Encounter (HOSPITAL_COMMUNITY): Payer: Self-pay

## 2023-12-14 ENCOUNTER — Other Ambulatory Visit: Payer: Self-pay

## 2023-12-14 ENCOUNTER — Emergency Department (HOSPITAL_COMMUNITY)
Admission: EM | Admit: 2023-12-14 | Discharge: 2023-12-15 | Disposition: A | Discharge status: Home / Self Care | Attending: Emergency Medicine | Admitting: Emergency Medicine

## 2023-12-14 DIAGNOSIS — L0231 Cutaneous abscess of buttock: Secondary | ICD-10-CM | POA: Diagnosis present

## 2023-12-14 DIAGNOSIS — M7989 Other specified soft tissue disorders: Secondary | ICD-10-CM

## 2023-12-14 LAB — BASIC METABOLIC PANEL WITH GFR
Anion gap: 8 (ref 5–15)
BUN: 13 mg/dL (ref 6–20)
CO2: 26 mmol/L (ref 22–32)
Calcium: 9.6 mg/dL (ref 8.9–10.3)
Chloride: 102 mmol/L (ref 98–111)
Creatinine, Ser: 0.59 mg/dL (ref 0.44–1.00)
GFR, Estimated: >60 mL/min (ref >60)
Glucose, Bld: 88 mg/dL (ref 70–99)
Potassium: 3.8 mmol/L (ref 3.5–5.1)
Sodium: 136 mmol/L (ref 135–145)

## 2023-12-14 LAB — CBC WITH DIFFERENTIAL/PLATELET
Abs Immature Granulocytes: 0.02 10*3/uL (ref 0.00–0.07)
Basophils Absolute: 0 10*3/uL (ref 0.0–0.1)
Basophils Relative: 1 %
Eosinophils Absolute: 0.3 10*3/uL (ref 0.0–0.5)
Eosinophils Relative: 4 %
HCT: 38.3 % (ref 36.0–46.0)
Hemoglobin: 12.8 g/dL (ref 12.0–15.0)
Immature Granulocytes: 0 %
Lymphocytes Relative: 34 %
Lymphs Abs: 3 10*3/uL (ref 0.7–4.0)
MCH: 29 pg (ref 26.0–34.0)
MCHC: 33.4 g/dL (ref 30.0–36.0)
MCV: 86.7 fL (ref 80.0–100.0)
Monocytes Absolute: 0.8 10*3/uL (ref 0.1–1.0)
Monocytes Relative: 9 %
Neutro Abs: 4.6 10*3/uL (ref 1.7–7.7)
Neutrophils Relative %: 52 %
Platelets: 320 10*3/uL (ref 150–400)
RBC: 4.42 MIL/uL (ref 3.87–5.11)
RDW: 12.4 % (ref 11.5–15.5)
WBC: 8.8 10*3/uL (ref 4.0–10.5)
nRBC: 0 % (ref 0.0–0.2)

## 2023-12-14 NOTE — ED Provider Triage Note (Signed)
 Emergency Medicine Provider Triage Evaluation Note  Annette Ashley , a 43 y.o. female  was evaluated in triage.  Pt complains of pain in the left medial buttock..  Been present since December 2024 and initially got better with antibiotics, then it recurred.  She followed up with a general surgeon, had an outpatient ultrasound and was monitoring it but has gotten bigger and larger, states this is the biggest that has been.  Denies fever or chills.  Review of Systems  Positive: Perirectal mass Negative: Fever, chills  Physical Exam  BP (!) 130/97 (BP Location: Left Arm)   Pulse 76   Temp 98.3 F (36.8 C)   Resp 19   SpO2 98%  Gen:   Awake, no distress   Resp:  Normal effort  MSK:   Moves extremities without difficulty  Other:    Medical Decision Making  Medically screening exam initiated at 8:16 PM.  Appropriate orders placed.  Shalin Manner was informed that the remainder of the evaluation will be completed by another provider, this initial triage assessment does not replace that evaluation, and the importance of remaining in the ED until their evaluation is complete.     Aimee Houseman, [place] 12/14/23 2018

## 2023-12-14 NOTE — ED Triage Notes (Signed)
 Patient c/o cyst on buttocks since August going through periods of inflammation and no irritation, currently biggest it has been, patient reports lots of discomfort and chills, no fever.

## 2023-12-15 MED ORDER — LIDOCAINE-EPINEPHRINE (PF) 2 %-1:200000 IJ SOLN
10.0000 mL | Freq: Once | INTRAMUSCULAR | Status: AC
  Administered 2023-12-15: 10 mL
  Filled 2023-12-15: qty 20

## 2023-12-15 MED ORDER — CEPHALEXIN 500 MG PO CAPS: 500.0000 mg | ORAL_CAPSULE | Freq: Two times a day (BID) | ORAL | 0 refills | Status: DC

## 2023-12-15 NOTE — ED Provider Notes (Addendum)
 [facility] Emergency Department Provider Note MRN:  [id number]  Arrival date & time: 12/15/23     Chief Complaint   Cyst   History of Present Illness   Annette Ashley is a 43 y.o. year-old female presents to the ED with chief complaint of cyst on left buttock.  She states that she has had this for a while.  She was seen by [facility] and had an evaluation done by general surgery.  She states that she was dissatisfied with the care she received and did not follow-up.  She states that now she has had some worsening pain of the soft tissue mass.  She denies fevers or chills.  She has tried warm compresses without relief..  History provided by patient.   Review of Systems  Pertinent positive and negative review of systems noted in HPI.    Physical Exam   Vitals:   12/14/23 1933 12/14/23 2242  BP: (!) 130/97 (!) 124/95  Pulse: 76 73  Resp: 19 20  Temp: 98.3 F (36.8 C) 98.2 F (36.8 C)  SpO2: 98% 100%    CONSTITUTIONAL:  non toxic-appearing, NAD NEURO:  Alert and oriented x 3, CN 3-12 grossly intact EYES:  eyes equal and reactive ENT/NECK:  Supple, no stridor  CARDIO:  normal rate, regular rhythm, appears well-perfused  PULM:  No respiratory distress,  GI/GU:  non-distended, chaperone present, 2x2 cm soft tissue mass, no erythema or fluctuance MSK/SPINE:  No gross deformities, no edema, moves all extremities  SKIN:  no rash, atraumatic   *Additional and/or pertinent findings included in MDM below  Diagnostic and Interventional Summary    EKG Interpretation Date/Time:    Ventricular Rate:    PR Interval:    QRS Duration:    QT Interval:    QTC Calculation:   R Axis:      Text Interpretation:         Labs Reviewed  CBC WITH DIFFERENTIAL/PLATELET  BASIC METABOLIC PANEL WITH GFR    No orders to display    Medications  lidocaine -EPINEPHrine  (XYLOCAINE  W/EPI) 2 %-1:200000 (PF) injection 10 mL (10 mLs Infiltration Given by Other  12/15/23 0240)     Procedures  /  Critical Care Aspiration of blood/fluid  Date/Time: 12/15/2023 2:46 AM  Performed by: Sherel Dikes, PA-C Authorized by: Sherel Dikes, PA-C  Consent: Verbal consent obtained. Risks and benefits: risks, benefits and alternatives were discussed Consent given by: patient Patient understanding: patient states understanding of the procedure being performed Patient consent: the patient's understanding of the procedure matches consent given Procedure consent: procedure consent matches procedure scheduled Relevant documents: relevant documents present and verified Test results: test results available and properly labeled Site marked: the operative site was marked Imaging studies: imaging studies available Required items: required blood products, implants, devices, and special equipment available Patient identity confirmed: verbally with patient Local anesthesia used: yes Anesthesia: local infiltration  Anesthesia: Local anesthesia used: yes Local Anesthetic: lidocaine  1% with epinephrine  Anesthetic total: 2 mL  Sedation: Patient sedated: no  Patient tolerance: patient tolerated the procedure well with no immediate complications Comments: No fluid was able to be aspirated.     ED Course and Medical Decision Making  I have reviewed the triage vital signs, the nursing notes, and pertinent available records from the EMR.  Social Determinants Affecting Complexity of Care: Patient has no clinically significant social determinants affecting this chief complaint..   ED Course:    Medical Decision Making Patient here with a soft tissue  nodule or mass to her left buttock.  She has been seen by general surgery with [facility], but did not follow-up because she was dissatisfied with the care plan.  She returns after worsening pain that has developed this week.  She denies fevers or chills.  Laboratory workup is reassuring.  No leukocytosis.  Vital  signs are stable.  It does not appear consistent with abscess.  I attempted to aspirate, but did not yield any aspirate.  Will have patient follow-up with general surgery.  She would like to be referred to general surgery in [place].  Risk Prescription drug management.         Consultants: No consultations were needed in caring for this patient.   Treatment and Plan: Emergency department workup does not suggest an emergent condition requiring admission or immediate intervention beyond  what has been performed at this time. The patient is safe for discharge and has  been instructed to return immediately for worsening symptoms, change in  symptoms or any other concerns    Final Clinical Impressions(s) / ED Diagnoses     ICD-10-CM   1. Soft tissue mass  M79.89       ED Discharge Orders          Ordered    cephALEXin (KEFLEX) 500 MG capsule  2 times daily        12/15/23 0244              Discharge Instructions Discussed with and Provided to Patient:     Discharge Instructions      It is uncertain what this soft tissue masses.  I recommend that you follow-up with general surgery.  Please contact the office listed above.  Return for fever, redness, or worsening symptoms.     Sherel Dikes, PA-C 12/15/23 0244    Sherel Dikes, PA-C 12/15/23 0254    Ballard Bongo, MD 12/15/23 [phone]

## 2023-12-15 NOTE — Discharge Instructions (Addendum)
 It is uncertain what this soft tissue masses.  I recommend that you follow-up with general surgery.  Please contact the office listed above.  Return for fever, redness, or worsening symptoms.

## 2024-01-03 ENCOUNTER — Ambulatory Visit: Payer: Self-pay | Admitting: Surgery

## 2024-05-23 ENCOUNTER — Ambulatory Visit: Payer: Self-pay | Admitting: Surgery
